# Patient Record
Sex: Male | Born: 1945 | Race: White | Hispanic: No | Marital: Married | Smoking: Never smoker
Health system: Southern US, Community
[De-identification: ages and names within clinical notes are randomized; demographics above are authoritative.]

## PROBLEM LIST (undated history)

## (undated) DIAGNOSIS — E039 Hypothyroidism, unspecified: Secondary | ICD-10-CM

## (undated) DIAGNOSIS — M1990 Unspecified osteoarthritis, unspecified site: Secondary | ICD-10-CM

## (undated) DIAGNOSIS — I10 Essential (primary) hypertension: Secondary | ICD-10-CM

## (undated) HISTORY — PX: OTHER SURGICAL HISTORY: SHX169

## (undated) HISTORY — PX: SHOULDER SURGERY: SHX246

## (undated) HISTORY — PX: KNEE CARTILAGE SURGERY: SHX688

---

## 2019-09-12 ENCOUNTER — Ambulatory Visit: Payer: Medicare Other | Attending: Internal Medicine

## 2019-09-12 DIAGNOSIS — Z23 Encounter for immunization: Secondary | ICD-10-CM | POA: Insufficient documentation

## 2019-09-12 NOTE — Progress Notes (Signed)
   Covid-19 Vaccination Clinic  Name:  [name]    MRN: [id number] DOB: 06-Jan-1946  09/12/2019  [name] was observed post Covid-19 immunization for 15 minutes without incidence. He was provided with Vaccine Information Sheet and instruction to access the V-Safe system.   [name] was instructed to call 911 with any severe reactions post vaccine: Marland Kitchen Difficulty breathing  . Swelling of your face and throat  . A fast heartbeat  . A bad rash all over your body  . Dizziness and weakness    Immunizations Administered    Name Date Dose VIS Date Route   Pfizer COVID-19 Vaccine 09/12/2019  3:24 PM 0.3 mL 07/02/2019 Intramuscular   Manufacturer: [employer], [employer]   Lot: [id number]   NDC: [address]-6

## 2019-10-06 ENCOUNTER — Ambulatory Visit: Payer: Medicare Other | Attending: Internal Medicine

## 2019-10-06 DIAGNOSIS — Z23 Encounter for immunization: Secondary | ICD-10-CM

## 2019-10-06 NOTE — Progress Notes (Signed)
   Covid-19 Vaccination Clinic  Name:  [name]    MRN: [id number] DOB: 25-Jul-1945  10/06/2019  Mr. Balaban was observed post Covid-19 immunization for 15 minutes without incident. He was provided with Vaccine Information Sheet and instruction to access the V-Safe system.   Mr. Manera was instructed to call 911 with any severe reactions post vaccine: Marland Kitchen Difficulty breathing  . Swelling of face and throat  . A fast heartbeat  . A bad rash all over body  . Dizziness and weakness   Immunizations Administered    Name Date Dose VIS Date Route   Pfizer COVID-19 Vaccine 10/06/2019 10:23 AM 0.3 mL 07/02/2019 Intramuscular   Manufacturer: [employer], [employer]   Lot: [id number]   NDC: [address]-5

## 2020-04-06 ENCOUNTER — Other Ambulatory Visit: Payer: Self-pay | Admitting: Internal Medicine

## 2020-04-06 ENCOUNTER — Ambulatory Visit
Admission: RE | Admit: 2020-04-06 | Discharge: 2020-04-06 | Disposition: A | Discharge status: Home / Self Care | Payer: Medicare Other | Source: Ambulatory Visit | Attending: Internal Medicine | Admitting: Internal Medicine

## 2020-04-06 DIAGNOSIS — M25552 Pain in left hip: Secondary | ICD-10-CM

## 2020-04-24 ENCOUNTER — Ambulatory Visit: Payer: Medicare Other | Admitting: Orthopaedic Surgery

## 2020-06-01 ENCOUNTER — Ambulatory Visit
Admission: RE | Admit: 2020-06-01 | Discharge: 2020-06-01 | Disposition: A | Discharge status: Home / Self Care | Payer: Medicare Other | Source: Ambulatory Visit | Attending: Internal Medicine | Admitting: Internal Medicine

## 2020-06-01 ENCOUNTER — Other Ambulatory Visit: Payer: Self-pay | Admitting: Internal Medicine

## 2020-06-01 DIAGNOSIS — R059 Cough, unspecified: Secondary | ICD-10-CM

## 2020-12-26 ENCOUNTER — Ambulatory Visit (INDEPENDENT_AMBULATORY_CARE_PROVIDER_SITE_OTHER): Payer: Medicare Other

## 2020-12-26 ENCOUNTER — Ambulatory Visit (INDEPENDENT_AMBULATORY_CARE_PROVIDER_SITE_OTHER): Payer: Medicare Other | Admitting: Orthopaedic Surgery

## 2020-12-26 VITALS — Ht 71.0 in | Wt 226.0 lb

## 2020-12-26 DIAGNOSIS — M1612 Unilateral primary osteoarthritis, left hip: Secondary | ICD-10-CM

## 2020-12-26 DIAGNOSIS — M25552 Pain in left hip: Secondary | ICD-10-CM

## 2020-12-26 MED ORDER — MELOXICAM 15 MG PO TABS: 15.0000 mg | ORAL_TABLET | Freq: Every day | ORAL | 3 refills | Status: DC | PRN

## 2020-12-26 NOTE — Progress Notes (Signed)
Office Visit Note   Patient: [name]           Date of Birth: 12-18-1945           MRN: [id number] Visit Date: 12/26/2020              Requested by: Georgann Housekeeper, MD [address] [place],  [place] [address] PCP: Georgann Housekeeper, MD   Assessment & Plan: Visit Diagnoses:  1. Pain in left hip   2. Unilateral primary osteoarthritis, left hip     Plan: He has a significant high pain threshold so right now we are only recommending activity modification and oral anti-inflammatories.  I did talk to him about a one-time intra-articular steroid injection done under fluoroscopic or ultrasound guidance.  He said he will call us if he would like to try that type of injection.  I did go over his x-rays with him and showed him a hip replacement model and gave him a handout about hip replacement surgery.  I will start meloxicam as an anti-inflammatory.  All questions and concerns were answered and addressed.  Follow-up is otherwise as needed.  Follow-Up Instructions: Return if symptoms worsen or fail to improve.   Orders:  Orders Placed This Encounter  Procedures  . XR Pelvis 1-2 Views   Meds ordered this encounter  Medications  . meloxicam (MOBIC) 15 MG tablet    Sig: Take 1 tablet (15 mg total) by mouth daily as needed for pain.    Dispense:  30 tablet    Refill:  3      Procedures: No procedures performed   Clinical Data: No additional findings.   Subjective: Chief Complaint  Patient presents with  . Left Hip - Pain  The patient is a very active and pleasant 75 year old gentleman who comes in with a 6-month history of left hip pain which is around his hip and groin area.  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  He has a remote history of right shoulder surgery.  He is a retired IT sales professional.  He says the shoulder is  now calming down but the hip still hurts with weightbearing activities.  He has been sitting for a while and gets up he has stiffness in the left hip and in the groin on the left side.  He denies any injury or surgery otherwise to the left hip.  He denies any sciatica or weakness.  He is very active 76 year old gentleman.  He takes an occasional over-the-counter anti-inflammatory.  He is not diabetic and not a smoker.  HPI  Review of Systems He currently denies any headache, chest pain, shortness of breath, fever, chills, nausea, vomiting  Objective: Vital Signs: Ht 5\' 11"  (1.803 m)   Wt 226 lb (102.5 kg)   BMI 31.52 kg/m   Physical Exam He is alert and orient x3 in no acute distress Ortho Exam Examination of his right hip shows a move smoothly and fluidly.  Examination of his left hip shows that it does hurt quite significantly with internal and external rotation and there is slight stiffness with rotation.  There is no pain over the trochanteric area or the IT band on the left side to palpation. Specialty Comments:  No specialty comments available.  Imaging: XR Pelvis 1-2 Views  Result Date: 12/26/2020  An AP pelvis shows severe osteoarthritis of the left hip.  There is superior lateral joint space narrowing which is significant as well as some slight flattening of the femoral head in this area and para-articular osteophytes.  There is also sclerotic changes.    PMFS History: Patient Active Problem List   Diagnosis Date Noted  . Unilateral primary osteoarthritis, left hip 12/26/2020   No past medical history on file.  No family history on file.   Social History   Occupational History  . Not on file  Tobacco Use  . Smoking status: Not on file  . Smokeless tobacco: Not on file  Substance and Sexual Activity  . Alcohol use: Not on file  . Drug use: Not on file  . Sexual activity: Not on file

## 2021-01-15 ENCOUNTER — Ambulatory Visit: Payer: Self-pay

## 2021-01-15 ENCOUNTER — Encounter: Payer: Self-pay | Admitting: Family Medicine

## 2021-01-15 ENCOUNTER — Ambulatory Visit (INDEPENDENT_AMBULATORY_CARE_PROVIDER_SITE_OTHER): Payer: Medicare Other | Admitting: Family Medicine

## 2021-01-15 DIAGNOSIS — M25552 Pain in left hip: Secondary | ICD-10-CM

## 2021-01-15 NOTE — Progress Notes (Signed)
Subjective: Patient is here for ultrasound-guided intra-articular left hip injection.   Pain is 8/10.  Objective:  Pain with IR.  Procedure: Ultrasound guided injection is preferred based studies that show increased duration, increased effect, greater accuracy, decreased procedural pain, increased response rate, and decreased cost with ultrasound guided versus blind injection.   Verbal informed consent obtained.  Time-out conducted.  Noted no overlying erythema, induration, or other signs of local infection. Ultrasound-guided left hip injection: After sterile prep with Betadine, injected 4 cc 0.25% bupivacaine without epinephrine and 6 mg betamethasone using a 22-gauge spinal needle, passing the needle through the iliofemoral ligament into the femoral head/neck junction.  Injectate seen filling joint capsule.  Good immediate relief.  Moderate effusion noted, clear yellow fluid.

## 2021-02-14 ENCOUNTER — Ambulatory Visit (INDEPENDENT_AMBULATORY_CARE_PROVIDER_SITE_OTHER): Payer: Medicare Other | Admitting: Orthopaedic Surgery

## 2021-02-14 ENCOUNTER — Other Ambulatory Visit: Payer: Self-pay

## 2021-02-14 VITALS — Ht 71.0 in | Wt 226.0 lb

## 2021-02-14 DIAGNOSIS — M25552 Pain in left hip: Secondary | ICD-10-CM

## 2021-02-14 DIAGNOSIS — M1612 Unilateral primary osteoarthritis, left hip: Secondary | ICD-10-CM | POA: Diagnosis not present

## 2021-02-14 NOTE — Progress Notes (Signed)
Office Visit Note   Patient: [name]           Date of Birth: [birth year]           MRN: [id number] Visit Date: 02/14/2021              Requested by: Georgann Housekeeper, MD [address] [place],  [place] [address] PCP: Georgann Housekeeper, MD   Assessment & Plan: Visit Diagnoses:  1. Unilateral primary osteoarthritis, left hip   2. Pain in left hip     Plan: We spoke in length in detail about hip replacement surgery.  I agree with him proceeding with this given the failed conservative treatment combined with his signs and symptoms with clinical exam findings and x-ray findings.  We had a long and thorough discussion about the surgery.  We talked about the risks and benefits of surgery.  I described the expected operative and postoperative course.  I showed him a hip replacement model and went over his x-rays.  He has a handout about hip replacement surgery as well.  He does wish to have this scheduled.  We will work on getting this surgery scheduled for a left total hip arthroplasty.  Follow-Up Instructions: Return for 2 weeks post-op.   Orders:  No orders of the defined types were placed in this encounter.  No orders of the defined types were placed in this encounter.     Procedures: No procedures performed   Clinical Data: No additional findings.   Subjective: Chief Complaint  Patient presents with   Left Hip - Pain  The patient is being seen in follow-up.  He has known severe end-stage arthritis of his left hip.  He has tried conservative treatment for well over a year now.  His hip pain is become daily on the left side.  It is definitely affecting his mobility, his quality of life, and his actives daily living.  He has tried intra-articular steroid injections that did not really help much at all.  His wife is with him today as well.  He has had no other acute change in medical status.  He is not obese.  He is not a diabetic.  He is interested in this point  with a left total hip arthroplasty and I agree with this considering the severity of both arthritis on x-ray and his clinical exam findings as well as signs and symptoms.  HPI  Review of Systems He currently denies any headache, chest pain, shortness of breath, fever, chills, nausea, vomiting  Objective: Vital Signs: Ht 5\' 11"  (1.803 m)   Wt 226 lb (102.5 kg)   BMI 31.52 kg/m   Physical Exam He is alert and orient x3 and in no acute distress Ortho Exam Examination of his right hip is normal.  Examination of his left hip shows significant stiffness with internal and external rotation as well as severe pain with rotation of the left hip. Specialty Comments:  No specialty comments available.  Imaging: No results found. An AP pelvis and lateral of his left hip from his last visit shows severe end-stage arthritis of the left hip.  There is complete loss of superior lateral joint space.  There are para-articular osteophytes and sclerotic changes in the femoral head and acetabulum.  PMFS History: Patient Active Problem List   Diagnosis Date Noted   Unilateral primary osteoarthritis, left hip 12/26/2020   No past medical history on file.  No family history on file.  No  past surgical history on file. Social History   Occupational History   Not on file  Tobacco Use   Smoking status: Not on file   Smokeless tobacco: Not on file  Substance and Sexual Activity   Alcohol use: Not on file   Drug use: Not on file   Sexual activity: Not on file

## 2021-03-16 ENCOUNTER — Other Ambulatory Visit: Payer: Self-pay | Admitting: Physician Assistant

## 2021-03-19 NOTE — Progress Notes (Signed)
DUE TO COVID-19 ONLY ONE VISITOR IS ALLOWED TO COME WITH YOU AND STAY IN THE WAITING ROOM ONLY DURING PRE OP AND PROCEDURE DAY OF SURGERY.  2 VISITOR  MAY VISIT WITH YOU AFTER SURGERY IN YOUR PRIVATE ROOM DURING VISITING HOURS ONLY!  YOU NEED TO HAVE A COVID 19 TEST ON__9/01/2021 ____@_  @_from  8am-3pm _____ , THIS TEST MUST BE DONE BEFORE SURGERY,  Covid test is done at [address] [place], [place] [address].  This is a drive thru.  No appt required. Please see map.                 Your procedure is scheduled on:  03/30/2021   Report to [facility] Main  Entrance   Report to admitting at     [phone]     Call this number if you have problems the morning of surgery [phone]    REMEMBER: NO  SOLID FOOD CANDY OR GUM AFTER MIDNIGHT. CLEAR LIQUIDS UNTIL   0530am         . NOTHING BY MOUTH EXCEPT CLEAR LIQUIDS UNTIL    0530am   . PLEASE FINISH ENSURE DRINK PER SURGEON ORDER  WHICH NEEDS TO BE COMPLETED AT      .  0530am     CLEAR LIQUID DIET   Foods Allowed                                                                    Coffee and tea, regular and decaf                            Fruit ices (not with fruit pulp)                                      Iced Popsicles                                    Carbonated beverages, regular and diet                                    Cranberry, grape and apple juices Sports drinks like Gatorade Lightly seasoned clear broth or consume(fat free) Sugar, honey syrup ___________________________________________________________________      BRUSH YOUR TEETH MORNING OF SURGERY AND RINSE YOUR MOUTH OUT, NO CHEWING GUM CANDY OR MINTS.     Take these medicines the morning of surgery with A SIP OF WATER:   amlodipine, synthroid   DO NOT TAKE ANY DIABETIC MEDICATIONS DAY OF YOUR SURGERY                               You may not have any metal on your body including hair pins and              piercings  Do not wear jewelry, make-up,  lotions, powders or perfumes, deodorant  Do not wear nail polish on your fingernails.  Do not shave  48 hours prior to surgery.              Men may shave face and neck.   Do not bring valuables to the hospital. [place].  Contacts, dentures or bridgework may not be worn into surgery.  Leave suitcase in the car. After surgery it may be brought to your room.     Patients discharged the day of surgery will not be allowed to drive home. IF YOU ARE HAVING SURGERY AND GOING HOME THE SAME DAY, YOU MUST HAVE AN ADULT TO DRIVE YOU HOME AND BE WITH YOU FOR 24 HOURS. YOU MAY GO HOME BY TAXI OR UBER OR ORTHERWISE, BUT AN ADULT MUST ACCOMPANY YOU HOME AND STAY WITH YOU FOR 24 HOURS.  Name and phone number of your driver:  Special Instructions: N/A              Please read over the following fact sheets you were given: _____________________________________________________________________  [facility] - Preparing for Surgery Before surgery, you can play an important role.  Because skin is not sterile, your skin needs to be as free of germs as possible.  You can reduce the number of germs on your skin by washing with CHG (chlorahexidine gluconate) soap before surgery.  CHG is an antiseptic cleaner which kills germs and bonds with the skin to continue killing germs even after washing. Please DO NOT use if you have an allergy to CHG or antibacterial soaps.  If your skin becomes reddened/irritated stop using the CHG and inform your nurse when you arrive at Short Stay. Do not shave (including legs and underarms) for at least 48 hours prior to the first CHG shower.  You may shave your face/neck. Please follow these instructions carefully:  1.  Shower with CHG Soap the night before surgery and the  morning of Surgery.  2.  If you choose to wash your hair, wash your hair first as usual with your  normal  shampoo.  3.  After you shampoo, rinse your hair  and body thoroughly to remove the  shampoo.                           4.  Use CHG as you would any other liquid soap.  You can apply chg directly  to the skin and wash                       Gently with a scrungie or clean washcloth.  5.  Apply the CHG Soap to your body ONLY FROM THE NECK DOWN.   Do not use on face/ open                           Wound or open sores. Avoid contact with eyes, ears mouth and genitals (private parts).                       Wash face,  Genitals (private parts) with your normal soap.             6.  Wash thoroughly, paying special attention to the area where your surgery  will be performed.  7.  Thoroughly rinse your body with  warm water from the neck down.  8.  DO NOT shower/wash with your normal soap after using and rinsing off  the CHG Soap.                9.  Pat yourself dry with a clean towel.            10.  Wear clean pajamas.            11.  Place clean sheets on your bed the night of your first shower and do not  sleep with pets. Day of Surgery : Do not apply any lotions/deodorants the morning of surgery.  Please wear clean clothes to the hospital/surgery center.  FAILURE TO FOLLOW THESE INSTRUCTIONS MAY RESULT IN THE CANCELLATION OF YOUR SURGERY PATIENT SIGNATURE_________________________________  NURSE SIGNATURE__________________________________  ________________________________________________________________________

## 2021-03-21 ENCOUNTER — Other Ambulatory Visit: Payer: Self-pay

## 2021-03-21 ENCOUNTER — Encounter (HOSPITAL_COMMUNITY): Payer: Self-pay

## 2021-03-21 ENCOUNTER — Telehealth: Payer: Self-pay

## 2021-03-21 ENCOUNTER — Encounter (HOSPITAL_COMMUNITY)
Admission: RE | Admit: 2021-03-21 | Discharge: 2021-03-21 | Disposition: A | Discharge status: Home / Self Care | Payer: Medicare Other | Source: Ambulatory Visit | Attending: Orthopaedic Surgery | Admitting: Orthopaedic Surgery

## 2021-03-21 DIAGNOSIS — Z01818 Encounter for other preprocedural examination: Secondary | ICD-10-CM | POA: Insufficient documentation

## 2021-03-21 HISTORY — DX: Essential (primary) hypertension: I10

## 2021-03-21 HISTORY — DX: Hypothyroidism, unspecified: E03.9

## 2021-03-21 HISTORY — DX: Unspecified osteoarthritis, unspecified site: M19.90

## 2021-03-21 LAB — BASIC METABOLIC PANEL
Anion gap: 8 (ref 5–15)
BUN: 23 mg/dL (ref 8–23)
CO2: 27 mmol/L (ref 22–32)
Calcium: 9.3 mg/dL (ref 8.9–10.3)
Chloride: 108 mmol/L (ref 98–111)
Creatinine, Ser: 0.99 mg/dL (ref 0.61–1.24)
GFR, Estimated: >60 mL/min (ref >60)
Glucose, Bld: 116 mg/dL — ABNORMAL HIGH (ref 70–99)
Potassium: 4 mmol/L (ref 3.5–5.1)
Sodium: 143 mmol/L (ref 135–145)

## 2021-03-21 LAB — CBC
HCT: 47.9 % (ref 39.0–52.0)
Hemoglobin: 15.6 g/dL (ref 13.0–17.0)
MCH: 27.7 pg (ref 26.0–34.0)
MCHC: 32.6 g/dL (ref 30.0–36.0)
MCV: 84.9 fL (ref 80.0–100.0)
Platelets: 253 10*3/uL (ref 150–400)
RBC: 5.64 MIL/uL (ref 4.22–5.81)
RDW: 13.2 % (ref 11.5–15.5)
WBC: 7.3 10*3/uL (ref 4.0–10.5)
nRBC: 0 % (ref 0.0–0.2)

## 2021-03-21 LAB — SURGICAL PCR SCREEN
MRSA, PCR: NEGATIVE
Staphylococcus aureus: POSITIVE — AB

## 2021-03-21 NOTE — Progress Notes (Addendum)
Anesthesia Review:  PCP: DR Georgann Housekeeper  Called and requested most recent ov note.[facility] They are to fax. Cardiologist : none  Chest x-ray : EKG :03/21/21  Echo : Stress test: Cardiac Cath :  Activity level: can do  a flgiht of stairs without diffficulty  Sleep Study/ CPAP : none  Fasting Blood Sugar :      / Checks Blood Sugar -- times a day:   Blood Thinner/ Instructions /Last Dose: ASA / Instructions/ Last Dose :   Covid test on 03/28/21 unless changes surgery to ambulatory .  PT to talk to surgeon office on 03/21/21 in regards to this.

## 2021-03-21 NOTE — Telephone Encounter (Signed)
Scheduled for THA on 03/30/21 8:30 a.m. at [facility].  Patient wants same day discharge.  Please advise.

## 2021-03-27 ENCOUNTER — Telehealth: Payer: Self-pay | Admitting: *Deleted

## 2021-03-27 NOTE — Telephone Encounter (Signed)
Ortho bundle pre-op call completed. 

## 2021-03-27 NOTE — Care Plan (Signed)
RNCM call to patient to review his upcoming Left total hip arthroplasty with Dr. Magnus Ivan at [facility] on 03/30/21. Patient is an Ortho bundle through [facility]. He is agreeable to case management and also would like to go home same day discharge after his surgery. Reviewed information related to this as well as post-op care instructions. He has a wife that is a retired Engineer, civil (consulting) that will be assisting him. He has no DME, but CM will order a FWW to be given to him by staff prior to discharge. Patient really didn't want this, but is agreeable that if he needs it, he will take it. Choice provided and referral made to [facility]. Will continue to follow for needs.

## 2021-03-29 ENCOUNTER — Other Ambulatory Visit: Payer: Self-pay

## 2021-03-29 NOTE — H&P (Signed)
TOTAL HIP ADMISSION H&P  Patient is admitted for left total hip arthroplasty.  Subjective:  Chief Complaint: left hip pain  HPI: [name], 75 y.o. male, has a history of pain and functional disability in the left hip(s) due to arthritis and patient has failed non-surgical conservative treatments for greater than 12 weeks to include NSAID's and/or analgesics, corticosteriod injections, flexibility and strengthening excercises, and activity modification.  Onset of symptoms was abrupt starting 1 years ago with gradually worsening course since that time.The patient noted no past surgery on the left hip(s).  Patient currently rates pain in the left hip at 10 out of 10 with activity. Patient has night pain, worsening of pain with activity and weight bearing, pain that interfers with activities of daily living, and pain with passive range of motion. Patient has evidence of subchondral sclerosis, periarticular osteophytes, and joint space narrowing by imaging studies. This condition presents safety issues increasing the risk of falls.  There is no current active infection.  Patient Active Problem List   Diagnosis Date Noted   Unilateral primary osteoarthritis, left hip 12/26/2020   Past Medical History:  Diagnosis Date   Arthritis    Hypertension    Hypothyroidism     Past Surgical History:  Procedure Laterality Date   KNEE CARTILAGE SURGERY     LEFT LEG SURGERY      GROWTH ON BONE   SHOULDER SURGERY     X 2    No current facility-administered medications for this encounter.   Current Outpatient Medications  Medication Sig Dispense Refill Last Dose   amLODipine (NORVASC) 5 MG tablet Take 5 mg by mouth daily. PT TAKES IN THE PM      Cholecalciferol (VITAMIN D3 PO) Take 1 tablet by mouth daily.      levothyroxine (SYNTHROID) 75 MCG tablet Take 75 mcg by mouth daily before breakfast. PT TAKES IN THE PM      Multiple Vitamin (MULTIVITAMIN WITH MINERALS) TABS tablet Take 1 tablet by  mouth daily.      multivitamin-lutein (OCUVITE-LUTEIN) CAPS capsule Take 1 capsule by mouth daily.      Omega-3 Fatty Acids (FISH OIL TRIPLE STRENGTH) 1400 MG CAPS Take 1,400 mg by mouth daily.      Pitavastatin Calcium (LIVALO) 2 MG TABS Take 2 mg by mouth daily. PT TAKES IN THE PM      meloxicam (MOBIC) 15 MG tablet Take 1 tablet (15 mg total) by mouth daily as needed for pain. 30 tablet 3    No Known Allergies  Social History   Tobacco Use   Smoking status: Never   Smokeless tobacco: Never  Substance Use Topics   Alcohol use: Yes    Comment: OCCAS    No family history on file.   Review of Systems  All other systems reviewed and are negative.  Objective:  Physical Exam Vitals reviewed.  Constitutional:      Appearance: Normal appearance.  HENT:     Head: Normocephalic and atraumatic.  Eyes:     Extraocular Movements: Extraocular movements intact.     Pupils: Pupils are equal, round, and reactive to light.  Cardiovascular:     Rate and Rhythm: Normal rate.  Pulmonary:     Effort: Pulmonary effort is normal.     Breath sounds: Normal breath sounds.  Abdominal:     Palpations: Abdomen is soft.  Musculoskeletal:     Cervical back: Normal range of motion and neck supple.     Left  hip: Tenderness and bony tenderness present. Decreased range of motion. Decreased strength.  Neurological:     Mental Status: He is alert and oriented to person, place, and time.  Psychiatric:        Behavior: Behavior normal.    Vital signs in last 24 hours:    Labs:   Estimated body mass index is 26.5 kg/m as calculated from the following:   Height as of 03/21/21: 5\' 11"  (1.803 m).   Weight as of 03/21/21: 86.2 kg.   Imaging Review Plain radiographs demonstrate severe degenerative joint disease of the left hip(s). The bone quality appears to be good for age and reported activity level.      Assessment/Plan:  End stage arthritis, left hip(s)  The patient history, physical  examination, clinical judgement of the provider and imaging studies are consistent with end stage degenerative joint disease of the left hip(s) and total hip arthroplasty is deemed medically necessary. The treatment options including medical management, injection therapy, arthroscopy and arthroplasty were discussed at length. The risks and benefits of total hip arthroplasty were presented and reviewed. The risks due to aseptic loosening, infection, stiffness, dislocation/subluxation,  thromboembolic complications and other imponderables were discussed.  The patient acknowledged the explanation, agreed to proceed with the plan and consent was signed. Patient is being admitted for inpatient treatment for surgery, pain control, PT, OT, prophylactic antibiotics, VTE prophylaxis, progressive ambulation and ADL's and discharge planning.The patient is planning to be discharged home with home health services

## 2021-03-30 ENCOUNTER — Ambulatory Visit (HOSPITAL_COMMUNITY): Payer: Medicare Other

## 2021-03-30 ENCOUNTER — Encounter (HOSPITAL_COMMUNITY)
Admission: RE | Disposition: A | Discharge status: Home / Self Care | Payer: Self-pay | Source: Ambulatory Visit | Attending: Orthopaedic Surgery

## 2021-03-30 ENCOUNTER — Ambulatory Visit (HOSPITAL_COMMUNITY): Payer: Medicare Other | Admitting: Physician Assistant

## 2021-03-30 ENCOUNTER — Encounter (HOSPITAL_COMMUNITY): Payer: Self-pay | Admitting: Orthopaedic Surgery

## 2021-03-30 ENCOUNTER — Ambulatory Visit (HOSPITAL_COMMUNITY): Payer: Medicare Other | Admitting: Certified Registered Nurse Anesthetist

## 2021-03-30 ENCOUNTER — Telehealth: Payer: Self-pay | Admitting: *Deleted

## 2021-03-30 ENCOUNTER — Ambulatory Visit (HOSPITAL_COMMUNITY)
Admission: RE | Admit: 2021-03-30 | Discharge: 2021-03-30 | Disposition: A | Discharge status: Home / Self Care | Payer: Medicare Other | Source: Ambulatory Visit | Attending: Orthopaedic Surgery | Admitting: Orthopaedic Surgery

## 2021-03-30 DIAGNOSIS — M1612 Unilateral primary osteoarthritis, left hip: Secondary | ICD-10-CM | POA: Diagnosis present

## 2021-03-30 DIAGNOSIS — Z96642 Presence of left artificial hip joint: Secondary | ICD-10-CM

## 2021-03-30 DIAGNOSIS — M25752 Osteophyte, left hip: Secondary | ICD-10-CM | POA: Diagnosis not present

## 2021-03-30 DIAGNOSIS — Z79899 Other long term (current) drug therapy: Secondary | ICD-10-CM | POA: Diagnosis not present

## 2021-03-30 DIAGNOSIS — Z79890 Hormone replacement therapy (postmenopausal): Secondary | ICD-10-CM | POA: Insufficient documentation

## 2021-03-30 DIAGNOSIS — Z419 Encounter for procedure for purposes other than remedying health state, unspecified: Secondary | ICD-10-CM

## 2021-03-30 HISTORY — PX: TOTAL HIP ARTHROPLASTY: SHX124

## 2021-03-30 LAB — TYPE AND SCREEN
ABO/RH(D): A POS
Antibody Screen: NEGATIVE

## 2021-03-30 LAB — ABO/RH: ABO/RH(D): A POS

## 2021-03-30 SURGERY — ARTHROPLASTY, HIP, TOTAL, ANTERIOR APPROACH
Anesthesia: Spinal | Site: Hip | Laterality: Left

## 2021-03-30 MED ORDER — METHOCARBAMOL 500 MG PO TABS: 500.0000 mg | ORAL_TABLET | Freq: Four times a day (QID) | ORAL | 1 refills | Status: DC | PRN

## 2021-03-30 MED ORDER — PROPOFOL 1000 MG/100ML IV EMUL
INTRAVENOUS | Status: AC
  Filled 2021-03-30: qty 100

## 2021-03-30 MED ORDER — LACTATED RINGERS IV SOLN: INTRAVENOUS | Status: DC

## 2021-03-30 MED ORDER — CEFAZOLIN SODIUM-DEXTROSE 1-4 GM/50ML-% IV SOLN: 1.0000 g | Freq: Four times a day (QID) | INTRAVENOUS | Status: DC

## 2021-03-30 MED ORDER — PROPOFOL 500 MG/50ML IV EMUL
INTRAVENOUS | Status: DC | PRN
  Administered 2021-03-30: 75 ug/kg/min via INTRAVENOUS

## 2021-03-30 MED ORDER — OXYCODONE HCL 5 MG PO TABS: 10.0000 mg | ORAL_TABLET | ORAL | Status: DC | PRN

## 2021-03-30 MED ORDER — ONDANSETRON HCL 4 MG/2ML IJ SOLN
INTRAMUSCULAR | Status: AC
  Filled 2021-03-30: qty 2

## 2021-03-30 MED ORDER — OXYCODONE HCL 5 MG/5ML PO SOLN: 5.0000 mg | Freq: Once | ORAL | Status: AC | PRN

## 2021-03-30 MED ORDER — OXYCODONE HCL 5 MG PO TABS: 5.0000 mg | ORAL_TABLET | Freq: Four times a day (QID) | ORAL | 0 refills | Status: DC | PRN

## 2021-03-30 MED ORDER — DEXAMETHASONE SODIUM PHOSPHATE 10 MG/ML IJ SOLN
INTRAMUSCULAR | Status: DC | PRN
  Administered 2021-03-30: 8 mg via INTRAVENOUS

## 2021-03-30 MED ORDER — POVIDONE-IODINE 10 % EX SWAB
2.0000 "application " | Freq: Once | CUTANEOUS | Status: AC
  Administered 2021-03-30: 2 via TOPICAL

## 2021-03-30 MED ORDER — METHOCARBAMOL 500 MG IVPB - SIMPLE MED: 500.0000 mg | Freq: Four times a day (QID) | INTRAVENOUS | Status: DC | PRN

## 2021-03-30 MED ORDER — FENTANYL CITRATE (PF) 100 MCG/2ML IJ SOLN
INTRAMUSCULAR | Status: DC | PRN
  Administered 2021-03-30 (×2): 50 ug via INTRAVENOUS

## 2021-03-30 MED ORDER — DEXAMETHASONE SODIUM PHOSPHATE 10 MG/ML IJ SOLN
INTRAMUSCULAR | Status: AC
  Filled 2021-03-30: qty 1

## 2021-03-30 MED ORDER — BUPIVACAINE IN DEXTROSE 0.75-8.25 % IT SOLN
INTRATHECAL | Status: DC | PRN
  Administered 2021-03-30: 1.8 mL via INTRATHECAL

## 2021-03-30 MED ORDER — FENTANYL CITRATE (PF) 100 MCG/2ML IJ SOLN
INTRAMUSCULAR | Status: AC
  Filled 2021-03-30: qty 2

## 2021-03-30 MED ORDER — OXYCODONE HCL 5 MG PO TABS
5.0000 mg | ORAL_TABLET | Freq: Once | ORAL | Status: AC | PRN
Start: 2021-03-30 — End: 2021-03-30
  Administered 2021-03-30: 5 mg via ORAL

## 2021-03-30 MED ORDER — PHENYLEPHRINE HCL-NACL 20-0.9 MG/250ML-% IV SOLN
INTRAVENOUS | Status: DC | PRN
  Administered 2021-03-30: 25 ug/min via INTRAVENOUS

## 2021-03-30 MED ORDER — DEXMEDETOMIDINE (PRECEDEX) IN NS 20 MCG/5ML (4 MCG/ML) IV SYRINGE
PREFILLED_SYRINGE | INTRAVENOUS | Status: DC | PRN
  Administered 2021-03-30: 4 ug via INTRAVENOUS

## 2021-03-30 MED ORDER — EPHEDRINE SULFATE-NACL 50-0.9 MG/10ML-% IV SOSY
PREFILLED_SYRINGE | INTRAVENOUS | Status: DC | PRN
  Administered 2021-03-30 (×2): 10 mg via INTRAVENOUS
  Administered 2021-03-30: 5 mg via INTRAVENOUS

## 2021-03-30 MED ORDER — SODIUM CHLORIDE 0.9 % IR SOLN
Status: DC | PRN
  Administered 2021-03-30: 1000 mL

## 2021-03-30 MED ORDER — PHENYLEPHRINE HCL-NACL 20-0.9 MG/250ML-% IV SOLN
INTRAVENOUS | Status: AC
  Filled 2021-03-30: qty 250

## 2021-03-30 MED ORDER — ORAL CARE MOUTH RINSE: 15.0000 mL | Freq: Once | OROMUCOSAL | Status: AC

## 2021-03-30 MED ORDER — ONDANSETRON HCL 4 MG/2ML IJ SOLN
4.0000 mg | Freq: Once | INTRAMUSCULAR | Status: DC | PRN
Start: 2021-03-30 — End: 2021-03-30

## 2021-03-30 MED ORDER — CHLORHEXIDINE GLUCONATE 0.12 % MT SOLN
15.0000 mL | Freq: Once | OROMUCOSAL | Status: AC
  Administered 2021-03-30: 15 mL via OROMUCOSAL

## 2021-03-30 MED ORDER — LACTATED RINGERS IV BOLUS
500.0000 mL | Freq: Once | INTRAVENOUS | Status: AC
  Administered 2021-03-30: 500 mL via INTRAVENOUS

## 2021-03-30 MED ORDER — METHOCARBAMOL 500 MG PO TABS: 500.0000 mg | ORAL_TABLET | Freq: Four times a day (QID) | ORAL | Status: DC | PRN

## 2021-03-30 MED ORDER — 0.9 % SODIUM CHLORIDE (POUR BTL) OPTIME
TOPICAL | Status: DC | PRN
  Administered 2021-03-30: 1000 mL

## 2021-03-30 MED ORDER — HYDROMORPHONE HCL 1 MG/ML IJ SOLN: 0.2500 mg | INTRAMUSCULAR | Status: DC | PRN

## 2021-03-30 MED ORDER — EPHEDRINE 5 MG/ML INJ
INTRAVENOUS | Status: AC
  Filled 2021-03-30: qty 5

## 2021-03-30 MED ORDER — PHENYLEPHRINE 40 MCG/ML (10ML) SYRINGE FOR IV PUSH (FOR BLOOD PRESSURE SUPPORT)
PREFILLED_SYRINGE | INTRAVENOUS | Status: DC | PRN
  Administered 2021-03-30 (×2): 40 ug via INTRAVENOUS

## 2021-03-30 MED ORDER — ONDANSETRON HCL 4 MG/2ML IJ SOLN
INTRAMUSCULAR | Status: DC | PRN
  Administered 2021-03-30: 4 mg via INTRAVENOUS

## 2021-03-30 MED ORDER — TRANEXAMIC ACID-NACL 1000-0.7 MG/100ML-% IV SOLN
1000.0000 mg | INTRAVENOUS | Status: AC
  Administered 2021-03-30: 1000 mg via INTRAVENOUS
  Filled 2021-03-30: qty 100

## 2021-03-30 MED ORDER — LACTATED RINGERS IV BOLUS
250.0000 mL | Freq: Once | INTRAVENOUS | Status: AC
  Administered 2021-03-30: 250 mL via INTRAVENOUS

## 2021-03-30 MED ORDER — CEFAZOLIN SODIUM-DEXTROSE 2-4 GM/100ML-% IV SOLN
2.0000 g | INTRAVENOUS | Status: AC
  Administered 2021-03-30: 2 g via INTRAVENOUS
  Filled 2021-03-30: qty 100

## 2021-03-30 MED ORDER — PROPOFOL 10 MG/ML IV BOLUS
INTRAVENOUS | Status: DC | PRN
  Administered 2021-03-30: 20 mg via INTRAVENOUS

## 2021-03-30 MED ORDER — STERILE WATER FOR IRRIGATION IR SOLN
Status: DC | PRN
  Administered 2021-03-30: 2000 mL

## 2021-03-30 MED ORDER — PHENYLEPHRINE 40 MCG/ML (10ML) SYRINGE FOR IV PUSH (FOR BLOOD PRESSURE SUPPORT)
PREFILLED_SYRINGE | INTRAVENOUS | Status: AC
  Filled 2021-03-30: qty 10

## 2021-03-30 MED ORDER — OXYCODONE HCL 5 MG PO TABS: 5.0000 mg | ORAL_TABLET | ORAL | Status: DC | PRN

## 2021-03-30 MED ORDER — OXYCODONE HCL 5 MG PO TABS
ORAL_TABLET | ORAL | Status: AC
  Filled 2021-03-30: qty 1

## 2021-03-30 SURGICAL SUPPLY — 41 items
ARTICULEZE HEAD (Hips) ×2 IMPLANT
BAG COUNTER SPONGE SURGICOUNT (BAG) ×2 IMPLANT
BAG ZIPLOCK 12X15 (MISCELLANEOUS) IMPLANT
BENZOIN TINCTURE PRP APPL 2/3 (GAUZE/BANDAGES/DRESSINGS) IMPLANT
BLADE SAW SGTL 18X1.27X75 (BLADE) ×2 IMPLANT
COVER PERINEAL POST (MISCELLANEOUS) ×2 IMPLANT
COVER SURGICAL LIGHT HANDLE (MISCELLANEOUS) ×2 IMPLANT
CUP ACET PNNCL SECTR W/GRIP 56 (Hips) ×1 IMPLANT
DRAPE FOOT SWITCH (DRAPES) ×2 IMPLANT
DRAPE STERI IOBAN 125X83 (DRAPES) ×2 IMPLANT
DRAPE U-SHAPE 47X51 STRL (DRAPES) ×4 IMPLANT
DRSG AQUACEL AG ADV 3.5X10 (GAUZE/BANDAGES/DRESSINGS) ×2 IMPLANT
DURAPREP 26ML APPLICATOR (WOUND CARE) ×2 IMPLANT
ELECT REM PT RETURN 15FT ADLT (MISCELLANEOUS) ×2 IMPLANT
ELIMINATOR HOLE APEX DEPUY (Hips) ×2 IMPLANT
GAUZE XEROFORM 1X8 LF (GAUZE/BANDAGES/DRESSINGS) ×2 IMPLANT
GLOVE SRG 8 PF TXTR STRL LF DI (GLOVE) ×2 IMPLANT
GLOVE SURG ENC MOIS LTX SZ7.5 (GLOVE) ×2 IMPLANT
GLOVE SURG NEOPR MICRO LF SZ8 (GLOVE) ×2 IMPLANT
GLOVE SURG UNDER POLY LF SZ8 (GLOVE) ×2
GOWN STRL REUS W/TWL XL LVL3 (GOWN DISPOSABLE) ×4 IMPLANT
HANDPIECE INTERPULSE COAX TIP (DISPOSABLE) ×1
HEAD ARTICULEZE (Hips) ×1 IMPLANT
HOLDER FOLEY CATH W/STRAP (MISCELLANEOUS) ×2 IMPLANT
KIT TURNOVER KIT A (KITS) ×2 IMPLANT
PACK ANTERIOR HIP CUSTOM (KITS) ×2 IMPLANT
PENCIL SMOKE EVACUATOR (MISCELLANEOUS) IMPLANT
PINN SECTOR W/GRIP ACE CUP 56 (Hips) ×2 IMPLANT
PINNACLE ALTRX PLUS 4 N 36X56 (Hips) ×2 IMPLANT
SET HNDPC FAN SPRY TIP SCT (DISPOSABLE) ×1 IMPLANT
STAPLER VISISTAT 35W (STAPLE) IMPLANT
STEM FEMORAL SZ5 HIGH ACTIS (Stem) ×2 IMPLANT
STRIP CLOSURE SKIN 1/2X4 (GAUZE/BANDAGES/DRESSINGS) IMPLANT
SUT ETHIBOND NAB CT1 #1 30IN (SUTURE) ×2 IMPLANT
SUT ETHILON 2 0 PS N (SUTURE) IMPLANT
SUT MNCRL AB 4-0 PS2 18 (SUTURE) IMPLANT
SUT VIC AB 0 CT1 36 (SUTURE) ×2 IMPLANT
SUT VIC AB 1 CT1 36 (SUTURE) ×2 IMPLANT
SUT VIC AB 2-0 CT1 27 (SUTURE) ×2
SUT VIC AB 2-0 CT1 TAPERPNT 27 (SUTURE) ×2 IMPLANT
TRAY FOLEY MTR SLVR 16FR STAT (SET/KITS/TRAYS/PACK) IMPLANT

## 2021-03-30 NOTE — Brief Op Note (Signed)
03/30/2021  9:48 AM  PATIENT:  [name]  74 y.o. male  PRE-OPERATIVE DIAGNOSIS:  OSTEOARTHRITIS LEFT HIP  POST-OPERATIVE DIAGNOSIS:  OSTEOARTHRITIS LEFT HIP  PROCEDURE:  Procedure(s): LEFT TOTAL HIP ARTHROPLASTY ANTERIOR APPROACH (Left)  SURGEON:  Surgeon(s) and Role:    Kathryne Hitch, MD - Primary  PHYSICIAN ASSISTANT:  Rexene Edison, PA-C  ANESTHESIA:   spinal  EBL:  150 mL   COUNTS:  YES  DICTATION: .Other Dictation: Dictation Number [id number]  PLAN OF CARE: Discharge to home after PACU  PATIENT DISPOSITION:  PACU - hemodynamically stable.   Delay start of Pharmacological VTE agent (>24hrs) due to surgical blood loss or risk of bleeding: no

## 2021-03-30 NOTE — Anesthesia Procedure Notes (Addendum)
Spinal  Patient location during procedure: OR Start time: 03/30/2021 8:33 AM End time: 03/30/2021 8:36 AM Reason for block: surgical anesthesia Staffing Performed: resident/CRNA  Anesthesiologist: Myrtie Soman, MD Resident/CRNA: West Pugh, CRNA Preanesthetic Checklist Completed: patient identified, IV checked, site marked, risks and benefits discussed, surgical consent, monitors and equipment checked, pre-op evaluation and timeout performed Spinal Block Patient position: sitting Prep: DuraPrep and site prepped and draped Patient monitoring: heart rate, continuous pulse ox and blood pressure Approach: midline Location: L3-4 Injection technique: single-shot Needle Needle type: Pencan and Introducer  Needle gauge: 24 G Needle length: 10 cm Assessment Sensory level: T4 Events: CSF return Additional Notes IV functioning, monitors applied to pt. Expiration date of kit checked and confirmed to be in date. Sterile prep and drape, hand hygiene and sterile gloved used. Pt was positioned and spine was prepped in sterile fashion. Skin was anesthetized with lidocaine. Free flow of clear CSF obtained prior to injecting local anesthetic into CSF x 1 attempt. Spinal needle aspirated freely following injection. Needle was carefully withdrawn, and pt tolerated procedure well. Loss of motor and sensory on exam post injection. Dr Kalman Shan present for procedure.

## 2021-03-30 NOTE — Care Plan (Signed)
Ortho Bundle Case Management Note  Patient Details  Name: [name] MRN: [id number] Date of Birth: [birth year]  [facility] call to patient to review his upcoming Left total hip arthroplasty with Dr. Magnus Ivan at [facility] on 03/30/21. Patient is an Ortho bundle through [facility]. He is agreeable to case management and also would like to go home same day discharge after his surgery. Reviewed information related to this as well as post-op care instructions. He has a wife that is a retired Engineer, civil (consulting) that will be assisting him. He has no DME, but CM will order a RW to be given to him by staff prior to discharge. Patient really didn't want this, but is agreeable that if he needs it, he will take it. Choice provided and referral made to [facility]. Will continue to follow for needs.                  DME Arranged:  Dan Humphreys rolling DME Agency:  [employer]  HH Arranged:  PT HH Agency:  [facility]  Additional Comments: Please contact me with any questions of if this plan should need to change.  Ralph Dowdy, RN, BSN, [employer]  [phone] 03/30/2021, 9:45 AM

## 2021-03-30 NOTE — Transfer of Care (Signed)
Immediate Anesthesia Transfer of Care Note  Patient: [name]  Procedure(s) Performed: LEFT TOTAL HIP ARTHROPLASTY ANTERIOR APPROACH (Left: Hip)  Patient Location: PACU  Anesthesia Type:MAC,Spinal  Level of Consciousness: awake, drowsy and patient cooperative  Airway & Oxygen Therapy: Patient Spontanous Breathing and Patient connected to face mask oxygen  Post-op Assessment: Report given to RN and Post -op Vital signs reviewed and stable  Post vital signs: Reviewed and stable  Last Vitals:  Vitals Value Taken Time  BP 95/55 03/30/21 1009  Temp    Pulse 68 03/30/21 1012  Resp 21 03/30/21 1012  SpO2 98 % 03/30/21 1012  Vitals shown include unvalidated device data.  Last Pain:  Vitals:   03/30/21 7121  TempSrc:   PainSc: 0-No pain         Complications: No notable events documented.

## 2021-03-30 NOTE — OR Nursing (Signed)
I&O catheter performed by Baruch Merl, RN per Dr. Magnus Ivan. urine output.

## 2021-03-30 NOTE — Discharge Instructions (Signed)

## 2021-03-30 NOTE — Anesthesia Procedure Notes (Signed)
Procedure Name: MAC Date/Time: 03/30/2021 8:33 AM Performed by: West Pugh, CRNA Pre-anesthesia Checklist: Patient identified, Emergency Drugs available, Suction available, Patient being monitored and Timeout performed Patient Re-evaluated:Patient Re-evaluated prior to induction Oxygen Delivery Method: Simple face mask Preoxygenation: Pre-oxygenation with 100% oxygen Induction Type: IV induction Placement Confirmation: positive ETCO2 Dental Injury: Teeth and Oropharynx as per pre-operative assessment

## 2021-03-30 NOTE — Telephone Encounter (Signed)
Ortho bundle D/C call today.

## 2021-03-30 NOTE — Op Note (Signed)
[name], [name] MEDICAL RECORD NO: [id number] ACCOUNT NO: [id number] DATE OF BIRTH: 02-May-1946 FACILITY: [facility] LOCATION: [facility] PHYSICIAN: Vanita Panda. Magnus Ivan, MD  Operative Report   DATE OF PROCEDURE: 03/30/2021  PREOPERATIVE DIAGNOSES:  Primary osteoarthritis and degenerative joint disease, left hip.  POSTOPERATIVE DIAGNOSES:  Primary osteoarthritis and degenerative joint disease, left hip.  PROCEDURE:  Left total hip arthroplasty through direct anterior approach.  IMPLANTS:  DePuy Sector Gription acetabular component size 56, size 36+4 neutral polyethylene liner, size 5 ACTIS femoral component with high offset, size 36+5 metal hip ball.  SURGEON:  Vanita Panda. Magnus Ivan, MD  ASSISTANT:  Richardean Canal, PA-C  ANESTHESIA:  Spinal.  ANTIBIOTICS:  2 g IV Ancef.  BLOOD LOSS:  150 mL.  COMPLICATIONS:  None.  INDICATIONS:  The patient is a 75 year old active individual well known to me.  He has well documented arthritis involving his left hip, and at this point, his pain is daily and it is detrimentally affecting his mobility, his quality of life and his  activities of daily living to the point he does wish to proceed with a total hip arthroplasty.  We talked in length and detail about the risk of acute blood loss anemia, nerve and vessel injury, fracture, infection, dislocation, DVT, implant failure and  skin and soft tissue issues as well as leg length discrepancies.  We talked about our goals being decrease pain, improve mobility and overall improve quality of life.  DESCRIPTION OF PROCEDURE:  After informed consent was obtained, appropriate left hip was marked. He was brought to the operating room and sat up on the stretcher where spinal anesthesia was obtained. He was laid in supine position on the stretcher.  I  was able to assess his leg length and placed traction boots on both his feet.  He was then placed supine on the Hana fracture table, the perineal post in  place and both legs in line skeletal traction device and no traction applied.  His left operative  hip was prepped and draped with DuraPrep and sterile drapes.  A timeout was called and he was identified correct patient, correct left hip.  We then made an incision just inferior and posterior to the anterior superior iliac spine and carried this  obliquely down the leg.  We dissected down tensor fascia lata muscle.  Tensor fascia was then divided longitudinally to proceed with direct anterior approach to the hip.  We identified and cauterized circumflex vessels, identified the hip capsule in an  L-type format, finding a large joint effusion once we opened up the hip capsule.  There was large periarticular lateral osteophytes as well.  We placed a Cobra retractor around the medial and lateral femoral neck and made our femoral neck cut with an  oscillating saw just proximal to the lesser trochanter and completed this with an osteotome.  We placed a corkscrew guide in the femoral head and removed the femoral head in its entirety.  It was a large femoral head with a wide area devoid of cartilage.   I then placed a bent Hohmann over the medial acetabular rim and removed periarticular osteophytes around the acetabular rim as well as remnants of the acetabular labrum and other debris.  We then began reaming under direct visualization from a size  small reamer, going up to a size 55, with all reamers placed under direct visualization, the last reamer also placed under direct fluoroscopy, so I could obtain my depth of reaming, my inclination and anteversion.  I  then placed a real DePuy Sector  Gription acetabular component size 56.  We placed apex hole eliminator guide and then went with a 36+4 neutral polyethylene liner based on medialization of the cup and his offset.  Attention was then turned to the femur with the leg externally rotated  120 degrees, extended and adducted. We are to place a Mueller retractor  medially and a Hohmann retractor behind the greater trochanter.  We released lateral joint capsule and then used a box cutting osteotome to enter the femoral canal.  We then used a  rongeur to lateralize.  We then began broaching using the ACTIS broaching system from a size 0 going up to a size 5. With the size 5 in place, we trialed a standard offset femoral neck and a 36+1.5 hip ball, reduced this in the acetabulum and based on  our exam, clinically as well as radiographic, we felt that he needed a high offset stem and just a little bit more length.  We dislocated the hip and removed the trial components.  We placed the real high offset ACTIS femoral component size 5 and the  real 36+5 metal hip ball and again reduced this in acetabulum.  We were pleased with stability, range of motion and assessment mechanically and radiographically in terms of offset and leg length.  We then irrigated the soft tissue with normal saline  solution.  We closed the joint capsule with interrupted #1 Ethibond suture followed by #1 Vicryl to close the tensor fascia.  0 Vicryl was used to close the deep tissue and 2-0 Vicryl was used to close subcutaneous tissue.  The skin was closed with  staples.  An Aquacel dressing was applied.  An in and out catheterization was performed because he hopes to go home today.  He was taken to recovery room in stable condition with all final counts being correct.  No complications noted.  Of note, Rexene Edison, PA-C, did assist during the entire case and assistance was crucial for facilitating every aspect of this case.   SHW D: 03/30/2021 9:47:06 am T: 03/30/2021 10:49:00 pm  JOB: [id number]/ [id number]

## 2021-03-30 NOTE — Anesthesia Preprocedure Evaluation (Signed)
Anesthesia Evaluation  Patient identified by MRN, date of birth, ID band Patient awake    Reviewed: Allergy & Precautions, NPO status , Patient's Chart, lab work & pertinent test results  Airway Mallampati: II  TM Distance: >3 FB Neck ROM: Full    Dental no notable dental hx.    Pulmonary neg pulmonary ROS,    Pulmonary exam normal breath sounds clear to auscultation       Cardiovascular hypertension, Pt. on medications Normal cardiovascular exam Rhythm:Regular Rate:Normal     Neuro/Psych negative neurological ROS  negative psych ROS   GI/Hepatic negative GI ROS, Neg liver ROS,   Endo/Other  Hypothyroidism   Renal/GU negative Renal ROS  negative genitourinary   Musculoskeletal  (+) Arthritis , Osteoarthritis,    Abdominal   Peds negative pediatric ROS (+)  Hematology negative hematology ROS (+)   Anesthesia Other Findings   Reproductive/Obstetrics negative OB ROS                             Anesthesia Physical Anesthesia Plan  ASA: 2  Anesthesia Plan: Spinal   Post-op Pain Management:    Induction: Intravenous  PONV Risk Score and Plan: 1 and Propofol infusion and Treatment may vary due to age or medical condition  Airway Management Planned: Simple Face Mask  Additional Equipment:   Intra-op Plan:   Post-operative Plan:   Informed Consent: I have reviewed the patients History and Physical, chart, labs and discussed the procedure including the risks, benefits and alternatives for the proposed anesthesia with the patient or authorized representative who has indicated his/her understanding and acceptance.     Dental advisory given  Plan Discussed with: CRNA and Surgeon  Anesthesia Plan Comments:         Anesthesia Quick Evaluation

## 2021-03-30 NOTE — Evaluation (Signed)
Physical Therapy Evaluation Patient Details Name: [name] MRN: [id number] DOB: 08-30-1945 Today's Date: 03/30/2021   History of Present Illness  Patient is 75 y.o. male s/p Lt THA anterior approach on 9/9/2 with PMH significant for OA, HTN, hyopthyroidism,  Clinical Impression  [name] is a 75 y.o. male POD 0 s/p Lt THA. Patient reports independence with mobility at baseline. Patient is now limited by functional impairments (see PT problem list below) and requires min guard/supervision for transfers and gait with RW. Patient was able to ambulate ~240 feet with RW and min guard/supervision and cues for safe walker management. Patient educated on safe sequencing for stair mobility and verbalized safe guarding position for people assisting with mobility. Patient instructed in exercises to facilitate ROM and circulation. Patient will benefit from continued skilled PT interventions to address impairments and progress towards PLOF. Patient has met mobility goals at adequate level for discharge home; will continue to follow if pt continues acute stay to progress towards Mod I goals.     Follow Up Recommendations Follow surgeon's recommendation for DC plan and follow-up therapies;Home health PT    Equipment Recommendations  Rolling walker with 5" wheels    Recommendations for Other Services       Precautions / Restrictions Precautions Precautions: Fall Restrictions Weight Bearing Restrictions: No Other Position/Activity Restrictions: WBAT      Mobility  Bed Mobility Overal bed mobility: Needs Assistance Bed Mobility: Supine to Sit;Sit to Supine     Supine to sit: Supervision Sit to supine: Supervision   General bed mobility comments: cues to initiate, no assist needed, no extra time.    Transfers Overall transfer level: Needs assistance Equipment used: Rolling walker (2 wheeled) Transfers: Sit to/from Stand Sit to Stand: Supervision         General transfer  comment: cue for safe hand placement with power up, no overt LOB noted. pt  Ambulation/Gait Ambulation/Gait assistance: Supervision Gait Distance (Feet): 240 Feet Assistive device: Rolling walker (2 wheeled) Gait Pattern/deviations: Step-to pattern;Step-through pattern;Decreased stride length Gait velocity: decr   General Gait Details: cues for step to pattern initially and pt progressed to step through with no overt LOB. cues at start and pt maintained safe proximity to RW.  Stairs Stairs: Yes Stairs assistance: Supervision Stair Management: No rails;Step to pattern;Forwards;With walker Number of Stairs: 2 General stair comments: cus for step sequencing, "up with good down with bad" and use of RW. pt verbalized safe understanding of guarding family to provide.  Wheelchair Mobility    Modified Rankin (Stroke Patients Only)       Balance Overall balance assessment: Mild deficits observed, not formally tested Sitting-balance support: Feet supported Sitting balance-Leahy Scale: Normal     Standing balance support: Bilateral upper extremity supported;During functional activity Standing balance-Leahy Scale: Fair                               Pertinent Vitals/Pain Pain Assessment: 0-10 Pain Score: 4  Pain Location: Lt hip Pain Descriptors / Indicators: Aching;Discomfort Pain Intervention(s): Limited activity within patient's tolerance;Monitored during session;RN gave pain meds during session;Repositioned    Home Living Family/patient expects to be discharged to:: Private residence Living Arrangements: Spouse/significant other Available Help at Discharge: Family Type of Home: House Home Access: Stairs to enter Entrance Stairs-Rails: None Entrance Stairs-Number of Steps: 2 Home Layout: Two level;Full bath on main level;Able to live on main level with bedroom/bathroom Home Equipment: None  Prior Function Level of Independence: Independent          Comments: pt enjoys the shooting range, firearms collecting, restoring old cars, quilting and sewing     Hand Dominance   Dominant Hand: Right    Extremity/Trunk Assessment   Upper Extremity Assessment Upper Extremity Assessment: Overall WFL for tasks assessed    Lower Extremity Assessment Lower Extremity Assessment: Overall WFL for tasks assessed    Cervical / Trunk Assessment Cervical / Trunk Assessment: Normal  Communication   Communication: No difficulties  Cognition Arousal/Alertness: Awake/alert Behavior During Therapy: WFL for tasks assessed/performed Overall Cognitive Status: Within Functional Limits for tasks assessed                                        General Comments      Exercises Total Joint Exercises Ankle Circles/Pumps: AROM;Both;10 reps Quad Sets: AROM;Left;Other reps (comment) Heel Slides: AROM;Left;Other reps (comment) Hip ABduction/ADduction: AROM;Left;Other reps (comment)   Assessment/Plan    PT Assessment Patient needs continued PT services  PT Problem List Decreased strength;Decreased range of motion;Decreased activity tolerance;Decreased balance;Decreased mobility;Decreased knowledge of use of DME;Decreased knowledge of precautions;Pain       PT Treatment Interventions DME instruction;Gait training;Stair training;Functional mobility training;Therapeutic activities;Therapeutic exercise;Balance training;Patient/family education    PT Goals (Current goals can be found in the Care Plan section)  Acute Rehab PT Goals Patient Stated Goal: get back to hobbies PT Goal Formulation: With patient Time For Goal Achievement: 04/06/21 Potential to Achieve Goals: Good    Frequency 7X/week   Barriers to discharge        Co-evaluation               AM-PAC PT "6 Clicks" Mobility  Outcome Measure Help needed turning from your back to your side while in a flat bed without using bedrails?: None Help needed moving from  lying on your back to sitting on the side of a flat bed without using bedrails?: A Little Help needed moving to and from a bed to a chair (including a wheelchair)?: A Little Help needed standing up from a chair using your arms (e.g., wheelchair or bedside chair)?: A Little Help needed to walk in hospital room?: A Little Help needed climbing 3-5 steps with a railing? : A Little 6 Click Score: 19    End of Session Equipment Utilized During Treatment: Gait belt Activity Tolerance: Patient tolerated treatment well Patient left: in bed;with call bell/phone within reach Nurse Communication: Mobility status PT Visit Diagnosis: Muscle weakness (generalized) (M62.81);Difficulty in walking, not elsewhere classified (R26.2)    Time: 9371-6967 PT Time Calculation (min) (ACUTE ONLY): 31 min   Charges:   PT Evaluation $PT Eval Low Complexity: 1 Low PT Treatments $Gait Training: 8-22 mins        Verner Mould, DPT Acute Rehabilitation Services Office [phone] Pager [phone]    Jacques Navy 03/30/2021, 2:56 PM

## 2021-03-30 NOTE — Interval H&P Note (Signed)
History and Physical Interval Note:  The patient understands that he is here today for a left total hip replacement to treat his left hip arthritis.  There has been no interval change in his medical status.  See recent H&P.  The risks and benefits of surgery have been discussed in detail and informed consent is obtained.  03/30/2021 7:04 AM  [name]  has presented today for surgery, with the diagnosis of OSTEOARTHRITIS LEFT HIP.  The various methods of treatment have been discussed with the patient and family. After consideration of risks, benefits and other options for treatment, the patient has consented to  Procedure(s): LEFT TOTAL HIP ARTHROPLASTY ANTERIOR APPROACH (Left) as a surgical intervention.  The patient's history has been reviewed, patient examined, no change in status, stable for surgery.  I have reviewed the patient's chart and labs.  Questions were answered to the patient's satisfaction.     Kathryne Hitch

## 2021-04-02 ENCOUNTER — Other Ambulatory Visit: Payer: Self-pay | Admitting: Orthopaedic Surgery

## 2021-04-02 ENCOUNTER — Telehealth: Payer: Self-pay | Admitting: *Deleted

## 2021-04-02 MED ORDER — ONDANSETRON 4 MG PO TBDP: 4.0000 mg | ORAL_TABLET | Freq: Three times a day (TID) | ORAL | 0 refills | Status: DC | PRN

## 2021-04-02 NOTE — Anesthesia Postprocedure Evaluation (Signed)
Anesthesia Post Note  Patient: [name]  Procedure(s) Performed: LEFT TOTAL HIP ARTHROPLASTY ANTERIOR APPROACH (Left: Hip)     Patient location during evaluation: PACU Anesthesia Type: Spinal Level of consciousness: oriented and awake and alert Pain management: pain level controlled Vital Signs Assessment: post-procedure vital signs reviewed and stable Respiratory status: spontaneous breathing, respiratory function stable and patient connected to nasal cannula oxygen Cardiovascular status: blood pressure returned to baseline and stable Postop Assessment: no headache, no backache and no apparent nausea or vomiting Anesthetic complications: no   No notable events documented.  Last Vitals:  Vitals:   03/30/21 1300 03/30/21 1355  BP: 138/84 130/80  Pulse: 72 75  Resp: 16 16  Temp:  36.6 C  SpO2: 94% 96%    Last Pain:  Vitals:   03/30/21 1355  TempSrc:   PainSc: 4                  Shakedra Beam S

## 2021-04-02 NOTE — Telephone Encounter (Signed)
Patient called requesting something for nausea. Discussed getting some Colace as well as Miralax for bowel movements as he is slightly constipated I believe. Doing well otherwise. Did have some blood tinged urine over the weekend, but no pain/burning/frequency. Noted it was most likely from catheter and to call us back if that changed. Temperature running around 99 consistently. I addressed all of these and told him these are all normal responses after surgery. Thanks.

## 2021-04-04 ENCOUNTER — Telehealth: Payer: Self-pay | Admitting: *Deleted

## 2021-04-04 ENCOUNTER — Other Ambulatory Visit: Payer: Self-pay | Admitting: Orthopaedic Surgery

## 2021-04-04 ENCOUNTER — Encounter (HOSPITAL_COMMUNITY): Payer: Self-pay | Admitting: Orthopaedic Surgery

## 2021-04-04 MED ORDER — OXYCODONE HCL 5 MG PO TABS: 5.0000 mg | ORAL_TABLET | Freq: Four times a day (QID) | ORAL | 0 refills | Status: DC | PRN

## 2021-04-04 NOTE — Telephone Encounter (Signed)
Patient called requesting refill of pain medication. He's doing well. Thanks.

## 2021-04-09 ENCOUNTER — Telehealth: Payer: Self-pay | Admitting: *Deleted

## 2021-04-09 ENCOUNTER — Other Ambulatory Visit: Payer: Self-pay | Admitting: Orthopaedic Surgery

## 2021-04-09 MED ORDER — OXYCODONE HCL 5 MG PO TABS: 5.0000 mg | ORAL_TABLET | Freq: Four times a day (QID) | ORAL | 0 refills | Status: DC | PRN

## 2021-04-09 NOTE — Telephone Encounter (Signed)
Call to patient and updated on refill of medication sent to his pharmacy.

## 2021-04-09 NOTE — Telephone Encounter (Signed)
Call from patient requesting refill of pain medication. Doing well-sees you this week for his 2 week appt. Thanks.

## 2021-04-11 ENCOUNTER — Ambulatory Visit (INDEPENDENT_AMBULATORY_CARE_PROVIDER_SITE_OTHER): Payer: Medicare Other | Admitting: Orthopaedic Surgery

## 2021-04-11 ENCOUNTER — Encounter: Payer: Self-pay | Admitting: Orthopaedic Surgery

## 2021-04-11 DIAGNOSIS — Z96642 Presence of left artificial hip joint: Secondary | ICD-10-CM

## 2021-04-11 NOTE — Progress Notes (Signed)
HPI: [name] returns today 2 weeks status post left total hip arthroplasty.  He is overall doing well.  He has no complaints.  He has been taking aspirin twice daily for DVT prophylaxis.  He continues to take Robaxin oxycodone as needed for pain.  No fevers chills.  Physical exam: Left hip good range of motion without pain.  Left calf supple nontender.  Dorsiflexion plantarflexion left ankle intact.  Ambulates without any assistive device.  Surgical incisions well approximated with staples no signs of infection.  Impression: Status post left total hip arthroplasty 03/30/2021  Plan: Staples removed Steri-Strips applied.  Scar tissue mobilization encouraged.  He will slowly increase his activities as tolerated.  Follow-up with [place] in 1 month sooner if there is any questions concerns.  We will take an 81 mg aspirin once daily for another week and then discontinue as he was on no aspirin prior to surgery.  Questions were encouraged and answered at length by Dr. Magnus Ivan and  myself.

## 2021-04-13 ENCOUNTER — Telehealth: Payer: Self-pay | Admitting: *Deleted

## 2021-04-13 NOTE — Telephone Encounter (Signed)
Ortho bundle 14 day call completed. 

## 2021-04-18 ENCOUNTER — Encounter: Payer: Medicare Other | Admitting: Orthopaedic Surgery

## 2021-05-09 ENCOUNTER — Ambulatory Visit (INDEPENDENT_AMBULATORY_CARE_PROVIDER_SITE_OTHER): Payer: Medicare Other | Admitting: Orthopaedic Surgery

## 2021-05-09 ENCOUNTER — Encounter: Payer: Self-pay | Admitting: Orthopaedic Surgery

## 2021-05-09 DIAGNOSIS — Z96642 Presence of left artificial hip joint: Secondary | ICD-10-CM

## 2021-05-09 NOTE — Progress Notes (Signed)
The patient comes in today at 6 weeks status post a left total hip arthroplasty.  He does have some mild pain but overall reports increased motion and strength and he says he is doing well overall.  He is a very active 75 year old gentleman.  He has had no acute changes in medical status and no fever chills or is listed.  He has good fluid range of motion of his left operative hip without any difficulty at all.  We talked in length in detail about the things he would need to bring him back to [place].  The only restriction he has no running.  The next time I need to see him back in 6 months from now with a standing low AP pelvis and lateral of his left operative hip.

## 2021-05-23 ENCOUNTER — Telehealth: Payer: Self-pay | Admitting: *Deleted

## 2021-05-23 NOTE — Telephone Encounter (Signed)
Attempted Ortho bundle call. No answer and left VM.

## 2021-05-23 NOTE — Telephone Encounter (Signed)
Ortho bundle 30 day call completed. °

## 2021-07-02 ENCOUNTER — Telehealth: Payer: Self-pay | Admitting: *Deleted

## 2021-07-02 NOTE — Telephone Encounter (Signed)
Patient called and is having increased pain in hip area (sounds like around flexors). He is very active and says his pain has really increased almost to before surgery. States weakness has increased as well. Recommended coming in to be seen since he's tried to work through it and it hasn't improved. He is making an appointment sooner with you to see if there is something wrong or other recommendations. He is 90 days post op. Just an FYI.

## 2021-07-04 ENCOUNTER — Encounter: Payer: Self-pay | Admitting: Orthopaedic Surgery

## 2021-07-04 ENCOUNTER — Other Ambulatory Visit: Payer: Self-pay

## 2021-07-04 ENCOUNTER — Ambulatory Visit (INDEPENDENT_AMBULATORY_CARE_PROVIDER_SITE_OTHER): Payer: Medicare Other | Admitting: Orthopaedic Surgery

## 2021-07-04 DIAGNOSIS — Z96642 Presence of left artificial hip joint: Secondary | ICD-10-CM

## 2021-07-04 MED ORDER — METHOCARBAMOL 750 MG PO TABS: 750.0000 mg | ORAL_TABLET | Freq: Three times a day (TID) | ORAL | 1 refills | Status: DC | PRN

## 2021-07-04 MED ORDER — PREDNISONE 50 MG PO TABS: ORAL_TABLET | ORAL | 0 refills | Status: DC

## 2021-07-04 NOTE — Progress Notes (Signed)
The patient is now just over 3 months status post a left total hip arthroplasty.  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  He has been having some left hip pain with lateral movements and getting out of car and getting up from a sitting position.  It is all the proximal thigh where he is hurting.  He denies any fever chills or other medical issues.  The left operative hip moves smoothly and fluidly.  There is certainly some pain around the proximal hip to be expected postoperative but his incision also looks good and there is no worrisome features.  I am going to try Robaxin-750 milligrams to take as needed as a muscle relaxant he will continue alternating ibuprofen and Tylenol.  I would also like to try 5 days of prednisone to see if this will calm down the inflammation that he is experiencing.  We will see him back in 4 weeks from now to see how he is doing overall.  I would like a standing AP pelvis and lateral of his left hip at that visit.

## 2021-07-05 ENCOUNTER — Telehealth: Payer: Self-pay | Admitting: *Deleted

## 2021-07-05 NOTE — Telephone Encounter (Signed)
Patient called and updated on instructions from MD about Prednisone.

## 2021-07-05 NOTE — Telephone Encounter (Signed)
Patient called me this morning and wanted to relay he is already 100% better overnight with the 1 dose of steroid and muscle relaxer. He asked if he should take the complete 5 days of steroid since he feels better, or if he should hold them and take as needed. I explained steroids are usually not prescribed that way, but would check with you. I told him he should complete all 5 days of the steroid to help gid rid of any inflammation and take muscle relaxer as needed. Wanted to make sure that was as intended. Thanks.

## 2021-08-01 ENCOUNTER — Other Ambulatory Visit: Payer: Self-pay

## 2021-08-01 ENCOUNTER — Ambulatory Visit (INDEPENDENT_AMBULATORY_CARE_PROVIDER_SITE_OTHER): Payer: Medicare Other

## 2021-08-01 ENCOUNTER — Ambulatory Visit (INDEPENDENT_AMBULATORY_CARE_PROVIDER_SITE_OTHER): Payer: Medicare Other | Admitting: Orthopaedic Surgery

## 2021-08-01 ENCOUNTER — Encounter: Payer: Self-pay | Admitting: Orthopaedic Surgery

## 2021-08-01 DIAGNOSIS — Z96642 Presence of left artificial hip joint: Secondary | ICD-10-CM

## 2021-08-01 NOTE — Progress Notes (Signed)
The patient is now 4 months status post a left total hip arthroplasty.  At his last visit we did put him on a steroid taper due to inflammation he said that worked greatly.  He is doing well overall.  He says he is about 75% better.  He is 76 years old and very active.  His left operative hip has just a little bit of stiffness throughout his arc of motion but his gait looks good when he walks.  An AP pelvis lateral left hip shows a well-seated total hip arthroplasty with no complicating features.  At this point I will need to see him back for 6 months unless he is having any issues.  We will have just an AP pelvis at that visit.  All questions and concerns were answered and addressed.

## 2021-11-07 ENCOUNTER — Ambulatory Visit: Payer: Medicare Other | Admitting: Orthopaedic Surgery

## 2022-01-29 ENCOUNTER — Ambulatory Visit (INDEPENDENT_AMBULATORY_CARE_PROVIDER_SITE_OTHER): Payer: Medicare Other | Admitting: Orthopaedic Surgery

## 2022-01-29 ENCOUNTER — Encounter: Payer: Self-pay | Admitting: Orthopaedic Surgery

## 2022-01-29 ENCOUNTER — Ambulatory Visit (INDEPENDENT_AMBULATORY_CARE_PROVIDER_SITE_OTHER): Payer: Medicare Other

## 2022-01-29 DIAGNOSIS — Z96642 Presence of left artificial hip joint: Secondary | ICD-10-CM

## 2022-01-29 NOTE — Progress Notes (Signed)
The patient is now 10 months status post a left total hip arthroplasty.  He says he is doing great.  He is 76 years old and very active.  He still reports some numbness with his thigh but otherwise is doing well.  He walks without assistive device and walks with no limp.  His ligaments are equal.  He has good range of motion of his left hip and it feels stable.  An AP pelvis and lateral of the left hip shows a well-seated total hip arthroplasty with no complicating features.  There is only slight heterotopic ossification but this does not cause any blocks or rotation at all.  At this point follow-up is as needed for the left hip.  He understands that if there are any issues at all as it relates that hip to hesitate to see [place].  All questions and concerns were answered and addressed.

## 2022-06-10 ENCOUNTER — Ambulatory Visit (INDEPENDENT_AMBULATORY_CARE_PROVIDER_SITE_OTHER): Payer: Medicare Other | Admitting: Orthopaedic Surgery

## 2022-06-10 ENCOUNTER — Ambulatory Visit (INDEPENDENT_AMBULATORY_CARE_PROVIDER_SITE_OTHER): Payer: Medicare Other

## 2022-06-10 ENCOUNTER — Encounter: Payer: Self-pay | Admitting: Orthopaedic Surgery

## 2022-06-10 DIAGNOSIS — G8929 Other chronic pain: Secondary | ICD-10-CM | POA: Diagnosis not present

## 2022-06-10 DIAGNOSIS — M25562 Pain in left knee: Secondary | ICD-10-CM

## 2022-06-10 NOTE — Progress Notes (Signed)
The patient is well-known to me.  He is a very active 76 year old gentleman where we replaced his left hip in September of last year.  He still has some numbness around the lateral aspect of the left hip which may be permanent at this point.  He comes in today though dealing with left knee pain for about 3 months now.  It is mainly on the medial aspect of his knee with no known injury.  He says his knee pain has lessened quite a bit recently.  There is no instability symptoms or signs and no locking catching.  This mainly was hurting when he was doing some yard work.  Examination of his left knee shows no effusion.  There is slight varus malalignment that is easily correctable.  He has only slight medial joint line tenderness but a negative McMurray's and negative Lachman's exam.  His range of motion is full.  2 views left knee show normal joint space with only slight varus malalignment.  The joint spaces very well-maintained and no significant arthritic findings and no effusion.  Since he is doing better the only thing I recommended really is activity modification and quad strengthening exercises.  If his knee starts to worsen in any way he knows to come back and see [place].  All question concerns were answered and addressed.

## 2022-10-26 ENCOUNTER — Ambulatory Visit
Admission: EM | Admit: 2022-10-26 | Discharge: 2022-10-26 | Disposition: A | Discharge status: Home / Self Care | Payer: Medicare Other | Attending: Nurse Practitioner | Admitting: Nurse Practitioner

## 2022-10-26 DIAGNOSIS — N3001 Acute cystitis with hematuria: Secondary | ICD-10-CM | POA: Diagnosis present

## 2022-10-26 DIAGNOSIS — R300 Dysuria: Secondary | ICD-10-CM | POA: Diagnosis present

## 2022-10-26 LAB — POCT URINALYSIS DIP (MANUAL ENTRY)
Bilirubin, UA: NEGATIVE
Glucose, UA: NEGATIVE mg/dL
Ketones, POC UA: NEGATIVE mg/dL
Nitrite, UA: NEGATIVE
Protein Ur, POC: 100 mg/dL — AB
Spec Grav, UA: 1.02 (ref 1.010–1.025)
Urobilinogen, UA: 0.2 E.U./dL
pH, UA: 6 (ref 5.0–8.0)

## 2022-10-26 MED ORDER — SULFAMETHOXAZOLE-TRIMETHOPRIM 800-160 MG PO TABS
1.0000 | ORAL_TABLET | Freq: Two times a day (BID) | ORAL | 0 refills | Status: DC
Start: 2022-10-26 — End: 2022-10-26

## 2022-10-26 MED ORDER — SULFAMETHOXAZOLE-TRIMETHOPRIM 800-160 MG PO TABS
1.0000 | ORAL_TABLET | Freq: Two times a day (BID) | ORAL | 0 refills | Status: AC
Start: 2022-10-26 — End: 2022-11-05

## 2022-10-26 NOTE — ED Triage Notes (Signed)
Pt states Thursday he developed chills, low grade fever, and pain with urination.   Home interventions: Augmentin (last taken around 0200), hydration

## 2022-10-26 NOTE — Discharge Instructions (Addendum)
The clinic will contact you with results of the urine culture done today Stop Augmentin and start Bactrim twice daily for 10 days Increase fluids  Follow-up with your PCP in 2 days for recheck Please go to the emergency room if you have any worsening symptoms

## 2022-10-26 NOTE — ED Provider Notes (Signed)
[facility]    CSN: [id number] Arrival date & time: 10/26/22  2426      History   Chief Complaint Chief Complaint  Patient presents with   Chills   Fever   Dysuria    HPI [name] is a 77 y.o. male presents for evaluation of dysuria.  Patient reports 2 days of urinary burning with urgency and frequency.  He states yesterday he had low-grade fevers of 99-100 but no additional fever since that time.  Denies any hematuria, nausea/vomiting, flank pain or back pain.  No testicular pain or swelling or abdominal pain.  No STD concern or exposure.  States he had 1 UTI in the past but denies history of recurrent UTIs or history of pyelonephritis.  No difficulty starting or stopping his urine stream.  His wife had some leftover Augmentin so he has taken 5 doses since Thursday with no improvement in his symptoms.  No other over-the-counter medications have been used for symptoms.  No other concerns at this time.   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 Authorizing Provider  amLODipine (NORVASC) 5 MG tablet Take 5 mg by mouth daily. PT TAKES IN THE PM    [provider]  Cholecalciferol (VITAMIN D3 PO) Take 1 tablet by  mouth daily.    [provider]  levothyroxine (SYNTHROID) 75 MCG tablet Take 75 mcg by mouth daily before breakfast. PT TAKES IN THE PM    [provider]  methocarbamol (ROBAXIN) 750 MG tablet Take 1 tablet (750 mg total) by mouth every 8 (eight) hours as needed for muscle spasms. 07/04/21   Kathryne Hitch, MD  Multiple Vitamin (MULTIVITAMIN WITH MINERALS) TABS tablet Take 1 tablet by mouth daily.    [provider]  multivitamin-lutein (OCUVITE-LUTEIN) CAPS capsule Take 1 capsule by mouth daily.    [provider]  Omega-3 Fatty Acids (FISH OIL TRIPLE STRENGTH) 1400 MG CAPS Take 1,400 mg by mouth daily.    [provider]  ondansetron (ZOFRAN ODT) 4 MG disintegrating tablet Take 1 tablet (4 mg total) by mouth every 8 (eight) hours as needed for nausea or vomiting. 04/02/21   Kathryne Hitch, MD  oxyCODONE (ROXICODONE) 5 MG immediate release tablet Take 1-2 tablets (5-10 mg total) by mouth every 6 (six) hours as needed for severe pain. 04/09/21   Kathryne Hitch, MD  Pitavastatin Calcium (LIVALO) 2 MG TABS Take 2 mg by mouth daily. PT TAKES IN THE PM    [provider]  predniSONE (DELTASONE) 50 MG tablet Take one table daily for 5 days. 07/04/21   Doneen Poisson  Y, MD  sulfamethoxazole-trimethoprim (BACTRIM DS) 800-160 MG tablet Take 1 tablet by mouth 2 (two) times daily for 10 days. 10/26/22 11/05/22  Radford PaxMayer, Jodi R, NP    Family History History reviewed. No pertinent family history.  Social History Social History   Tobacco Use   Smoking status: Never   Smokeless tobacco: Never  Vaping Use   Vaping Use: Never used  Substance Use Topics   Alcohol use: Yes    Comment: OCCAS   Drug use: Never     Allergies   Patient has no known allergies.   Review of Systems Review of Systems  Constitutional:  Positive for fever.  Genitourinary:  Positive for dysuria and frequency.     Physical Exam Triage  Vital Signs ED Triage Vitals  Enc Vitals Group     BP 10/26/22 0928 (!) 145/80     Pulse Rate 10/26/22 0928 79     Resp --      Temp 10/26/22 0928 97.6 F (36.4 C)     Temp Source 10/26/22 0928 Oral     SpO2 10/26/22 0928 93 %     Weight --      Height --      Head Circumference --      Peak Flow --      Pain Score 10/26/22 0927 8     Pain Loc --      Pain Edu? --      Excl. in GC? --    No data found.  Updated Vital Signs BP (!) 145/80 (BP Location: Right Arm)   Pulse 79   Temp 97.6 F (36.4 C) (Oral)   SpO2 93%   Visual Acuity Right Eye Distance:   Left Eye Distance:   Bilateral Distance:    Right Eye Near:   Left Eye Near:    Bilateral Near:     Physical Exam Vitals and nursing note reviewed.  Constitutional:      Appearance: Normal appearance.  HENT:     Head: Normocephalic and atraumatic.  Eyes:     Pupils: Pupils are equal, round, and reactive to light.  Cardiovascular:     Rate and Rhythm: Normal rate.  Pulmonary:     Effort: Pulmonary effort is normal.  Abdominal:     Tenderness: There is no right CVA tenderness or left CVA tenderness.  Skin:    General: Skin is warm and dry.  Neurological:     General: No focal deficit present.     Mental Status: He is alert and oriented to person, place, and time.  Psychiatric:        Mood and Affect: Mood normal.        Behavior: Behavior normal.      UC Treatments / Results  Labs (all labs ordered are listed, but only abnormal results are displayed) Labs Reviewed  POCT URINALYSIS DIP (MANUAL ENTRY) - Abnormal; Notable for the following components:      Result Value   Color, UA light yellow (*)    Blood, UA small (*)    Protein Ur, POC =100 (*)    Leukocytes, UA Trace (*)    All other components within normal limits  URINE CULTURE    EKG   Radiology No results found.  Procedures Procedures (including critical care time)  Medications Ordered in UC Medications - No data to  display  Initial Impression / Assessment and Plan / UC Course  I have reviewed the triage vital signs and the  nursing notes.  Pertinent labs & imaging results that were available during my care of the patient were reviewed by me and considered in my medical decision making (see chart for details).     Reviewed symptoms and exam with patient.  No CVAT or nausea/vomiting and patient is afebrile in clinic Will send urine culture and will start Bactrim.  He was advised not to take any more doses of Augmentin Increase fluids Patient follow-up with PCP in 2 days for recheck Strict ER precautions reviewed and patient verbalized understanding Final Clinical Impressions(s) / UC Diagnoses   Final diagnoses:  Dysuria  Acute cystitis with hematuria     Discharge Instructions      The clinic will contact you with results of the urine culture done today Stop Augmentin and start Bactrim twice daily for 10 days Increase fluids  Follow-up with your PCP in 2 days for recheck Please go to the emergency room if you have any worsening symptoms     ED Prescriptions     Medication Sig Dispense Auth. Provider   sulfamethoxazole-trimethoprim (BACTRIM DS) 800-160 MG tablet  (Status: Discontinued) Take 1 tablet by mouth 2 (two) times daily for 10 days. 20 tablet Radford PaxMayer, Jodi R, NP   sulfamethoxazole-trimethoprim (BACTRIM DS) 800-160 MG tablet Take 1 tablet by mouth 2 (two) times daily for 10 days. 20 tablet Radford PaxMayer, Jodi R, NP      PDMP not reviewed this encounter.   Radford PaxMayer, Jodi R, NP 10/26/22 [phone]

## 2022-10-27 LAB — URINE CULTURE: Culture: NO GROWTH

## 2022-11-05 ENCOUNTER — Emergency Department (HOSPITAL_BASED_OUTPATIENT_CLINIC_OR_DEPARTMENT_OTHER)
Admission: EM | Admit: 2022-11-05 | Discharge: 2022-11-05 | Disposition: A | Discharge status: Home / Self Care | Payer: Medicare Other | Attending: Emergency Medicine | Admitting: Emergency Medicine

## 2022-11-05 ENCOUNTER — Other Ambulatory Visit (HOSPITAL_BASED_OUTPATIENT_CLINIC_OR_DEPARTMENT_OTHER): Payer: Self-pay

## 2022-11-05 ENCOUNTER — Emergency Department (HOSPITAL_BASED_OUTPATIENT_CLINIC_OR_DEPARTMENT_OTHER): Payer: Medicare Other

## 2022-11-05 ENCOUNTER — Other Ambulatory Visit: Payer: Self-pay

## 2022-11-05 DIAGNOSIS — I10 Essential (primary) hypertension: Secondary | ICD-10-CM | POA: Diagnosis not present

## 2022-11-05 DIAGNOSIS — R1084 Generalized abdominal pain: Secondary | ICD-10-CM | POA: Insufficient documentation

## 2022-11-05 DIAGNOSIS — R339 Retention of urine, unspecified: Secondary | ICD-10-CM | POA: Diagnosis present

## 2022-11-05 LAB — URINALYSIS, ROUTINE W REFLEX MICROSCOPIC
Bilirubin Urine: NEGATIVE
Glucose, UA: NEGATIVE mg/dL
Hgb urine dipstick: NEGATIVE
Ketones, ur: NEGATIVE mg/dL
Nitrite: NEGATIVE
Protein, ur: NEGATIVE mg/dL
Specific Gravity, Urine: 1.014 (ref 1.005–1.030)
pH: 6 (ref 5.0–8.0)

## 2022-11-05 LAB — CBC
HCT: 45.1 % (ref 39.0–52.0)
Hemoglobin: 14.7 g/dL (ref 13.0–17.0)
MCH: 27 pg (ref 26.0–34.0)
MCHC: 32.6 g/dL (ref 30.0–36.0)
MCV: 82.8 fL (ref 80.0–100.0)
Platelets: 331 10*3/uL (ref 150–400)
RBC: 5.45 MIL/uL (ref 4.22–5.81)
RDW: 13.6 % (ref 11.5–15.5)
WBC: 8.6 10*3/uL (ref 4.0–10.5)
nRBC: 0 % (ref 0.0–0.2)

## 2022-11-05 LAB — BASIC METABOLIC PANEL
Anion gap: 11 (ref 5–15)
BUN: 19 mg/dL (ref 8–23)
CO2: 24 mmol/L (ref 22–32)
Calcium: 9.6 mg/dL (ref 8.9–10.3)
Chloride: 100 mmol/L (ref 98–111)
Creatinine, Ser: 1.22 mg/dL (ref 0.61–1.24)
GFR, Estimated: >60 mL/min (ref >60)
Glucose, Bld: 100 mg/dL — ABNORMAL HIGH (ref 70–99)
Potassium: 4.2 mmol/L (ref 3.5–5.1)
Sodium: 135 mmol/L (ref 135–145)

## 2022-11-05 MED ORDER — IOHEXOL 300 MG/ML  SOLN
100.0000 mL | Freq: Once | INTRAMUSCULAR | Status: AC | PRN
  Administered 2022-11-05: 80 mL via INTRAVENOUS

## 2022-11-05 MED ORDER — CEPHALEXIN 500 MG PO CAPS: 500.0000 mg | ORAL_CAPSULE | Freq: Four times a day (QID) | ORAL | 0 refills | Status: DC

## 2022-11-05 NOTE — ED Notes (Signed)
Pt given Ginger Ale, per [place] [facility] [place].

## 2022-11-05 NOTE — ED Notes (Signed)
Post void bladder scan reading .

## 2022-11-05 NOTE — ED Notes (Signed)
Patient transported to CT 

## 2022-11-05 NOTE — ED Triage Notes (Signed)
Pt arrived POV from PCP. Pt was being seeing by PCP for follow up after being treated for UTI for approx 10 days. Pt finished bactrim yesterday but reports over the past few days he has had increased abd distension, increased urinary frequency with minimal output and PCP is concerned pt may have bladder outlet obstruction with his hx of BPH and current s/s. Pt caox4, ambulatory, NAD, c/o minimal pain in groin. Afebrile.

## 2022-11-05 NOTE — Discharge Instructions (Addendum)
You are seen today in the emergency department due to urinary retention.  Will need to radical Foley catheter until you are seen by urology, please call to schedule an appointment for later this week for evaluation with [facility].  There were signs of infection on your CT scan as well as the urine, start taking the Keflex 1000 mg twice daily for 5 days.  Return to the ED if you have any fevers, vomiting, new or concerning symptoms or malfunction of the catheter.  Otherwise follow-up with urology in the office.

## 2022-11-05 NOTE — ED Provider Notes (Signed)
[place] EMERGENCY DEPARTMENT AT [facility] Provider Note   CSN: [id number] Arrival date & time: 11/05/22  1224     History  Chief Complaint  Patient presents with   Urinary Retention    [name] is a 77 y.o. male.  HPI   Patient with medical history of hypertension, BPH presents to the emergency department due to urinary retention.  Seen earlier today at family medicine doctor, concern for bladder outlet obstruction sent to ED for further evaluation and likely Foley catheter.    Patient tells me symptom started about a week and a half ago, he was having decreased urinary output diagnosed with UTI been on Bactrim for the last 10 days.  Started feeling better until yesterday when he has again been having decreased urinary output and started having abdominal distention with some mild pain.  Endorses nausea but no vomiting.  History of prostate but is never required Foley catheter previously, no history of stones or obvious prostatitis.  Is not having any increased urinary frequency or penile discharge.  No previous abdominal surgeries.  Home Medications Prior to Admission medications   Medication Sig Start Date End Date Taking? Authorizing Provider  cephALEXin (KEFLEX) 500 MG capsule Take 1 capsule (500 mg total) by mouth 4 (four) times daily. 11/05/22  Yes Theron Arista, PA-C  amLODipine (NORVASC) 5 MG tablet Take 5 mg by mouth daily. PT TAKES IN THE PM    [provider]  Cholecalciferol (VITAMIN D3 PO) Take 1 tablet by mouth daily.    [provider]  levothyroxine (SYNTHROID) 75 MCG tablet Take 75 mcg by mouth daily before breakfast. PT TAKES IN THE PM    [provider]  methocarbamol (ROBAXIN) 750 MG tablet Take 1 tablet (750 mg total) by mouth every 8 (eight) hours as needed for muscle spasms. 07/04/21   Kathryne Hitch, MD  Multiple Vitamin (MULTIVITAMIN WITH MINERALS) TABS tablet Take 1 tablet by mouth daily.    [provider]  multivitamin-lutein (OCUVITE-LUTEIN) CAPS capsule Take 1 capsule by mouth daily.    [provider]  Omega-3 Fatty Acids (FISH OIL TRIPLE STRENGTH) 1400 MG CAPS Take 1,400 mg by mouth daily.    [provider]  ondansetron (ZOFRAN ODT) 4 MG disintegrating tablet Take 1 tablet (4 mg total) by mouth every 8 (eight) hours as needed for nausea or vomiting. 04/02/21   Kathryne Hitch, MD  oxyCODONE (ROXICODONE) 5 MG immediate release tablet Take 1-2 tablets (5-10 mg total) by mouth every 6 (six) hours as needed for severe pain. 04/09/21   Kathryne Hitch, MD  Pitavastatin Calcium (LIVALO) 2 MG TABS Take 2 mg by mouth daily. PT TAKES IN THE PM    [provider]  predniSONE (DELTASONE) 50 MG tablet Take one table daily for 5 days. 07/04/21   Kathryne Hitch, MD  sulfamethoxazole-trimethoprim (BACTRIM DS) 800-160 MG tablet Take 1 tablet by mouth 2 (two) times daily for 10 days. 10/26/22 11/05/22  Radford Pax, NP      Allergies    Patient has no known allergies.    Review of Systems   Review of Systems  Physical Exam Updated Vital Signs BP (!) 144/84 (BP Location: Right Arm)   Pulse 63   Temp 98.2 F (36.8 C) (Oral)   Resp 18   Ht 5\' 11"  (1.803 m)   Wt 107 kg   SpO2 98%   BMI 32.92 kg/m  Physical Exam Vitals and nursing  note reviewed. Exam conducted with a chaperone present.  Constitutional:      Appearance: Normal appearance.  HENT:     Head: Normocephalic and atraumatic.  Eyes:     General: No scleral icterus.       Right eye: No discharge.        Left eye: No discharge.     Extraocular Movements: Extraocular movements intact.     Pupils: Pupils are equal, round, and reactive to light.  Cardiovascular:     Rate and Rhythm: Normal rate and regular rhythm.     Pulses: Normal pulses.     Heart sounds: Normal heart sounds. No murmur heard.    No friction rub. No gallop.  Pulmonary:     Effort: Pulmonary effort is  normal. No respiratory distress.     Breath sounds: Normal breath sounds.  Abdominal:     General: Abdomen is flat. Bowel sounds are normal. There is distension.     Palpations: Abdomen is soft.     Tenderness: There is generalized abdominal tenderness.  Skin:    General: Skin is warm and dry.     Coloration: Skin is not jaundiced.  Neurological:     Mental Status: He is alert. Mental status is at baseline.     Coordination: Coordination normal.     ED Results / Procedures / Treatments   Labs (all labs ordered are listed, but only abnormal results are displayed) Labs Reviewed  URINALYSIS, ROUTINE W REFLEX MICROSCOPIC - Abnormal; Notable for the following components:      Result Value   Leukocytes,Ua TRACE (*)    Bacteria, UA RARE (*)    All other components within normal limits  BASIC METABOLIC PANEL - Abnormal; Notable for the following components:   Glucose, Bld 100 (*)    All other components within normal limits  CBC    EKG None  Radiology CT ABDOMEN PELVIS W CONTRAST  Result Date: 11/05/2022 CLINICAL DATA:  Recent treatment for UTI with increasing abdominal distention and urinary frequency. EXAM: CT ABDOMEN AND PELVIS WITH CONTRAST TECHNIQUE: Multidetector CT imaging of the abdomen and pelvis was performed using the standard protocol following bolus administration of intravenous contrast. RADIATION DOSE REDUCTION: This exam was performed according to the departmental dose-optimization program which includes automated exposure control, adjustment of the mA and/or kV according to patient size and/or use of iterative reconstruction technique. CONTRAST:  80mL OMNIPAQUE IOHEXOL 300 MG/ML  SOLN COMPARISON:  None Available. FINDINGS: Lower chest: There is some linear opacity lung bases likely scar or atelectasis. No pleural effusion. Hepatobiliary: No focal liver abnormality is seen. No gallstones, gallbladder wall thickening, or biliary dilatation. Pancreas: Unremarkable. No  pancreatic ductal dilatation or surrounding inflammatory changes. Spleen: Normal in size without focal abnormality.  Small splenules. Adrenals/Urinary Tract: Adrenal gland is preserved. No right-sided enhancing renal mass or collecting system dilatation. There is a tiny Bosniak 1 cyst posteromedial. No specific imaging follow-up. The right ureter has a normal course and caliber down to the bladder. The bladder is contracted with a Foley catheter. No enhancing left-sided renal mass but there is mild collecting system dilatation and some adjacent stranding. The ectatic ureter seen down to the level of the bladder. No ureteral stone seen. There is a punctate lower pole nonobstructing left-sided renal stone as seen best on coronal image 48 of series 5. No delayed enhancement or excretion from the left kidney. Stomach/Bowel: On this non oral contrast exam, large bowel has a normal course and caliber.  Diffuse colonic stool. The appendix is not clearly seen in the right lower quadrant but no pericecal stranding or fluid. Few sigmoid colon diverticula. Stomach and small bowel are nondilated. Vascular/Lymphatic: Normal caliber aorta and IVC with some vascular calcifications. No specific abnormal lymph node enlargement identified in the abdomen and pelvis. Reproductive: Enlarged prostate with mass effect along the base of the bladder. Other: There is some pelvic stranding seen near the margin of the prostate and bladder. Slightly more on the left than right. Musculoskeletal: Significant streak artifact related to the patient's left hip arthroplasty obscuring portions of the left hemipelvis. Degenerative changes are seen scattered in the spine and pelvis. Synovial herniation pit along the right femoral neck. IMPRESSION: Punctate nonobstructing left-sided renal stone. There is mild left-sided renal collecting system dilatation down to the bladder without a ureteral stone. Etiology is uncertain. Recommend further workup when  appropriate if there is no known history such as a delayed contrast CT to evaluate the urothelium. Enlarged prostate with mass effect along the bladder. There is Foley catheter in place with the contracted urinary bladder. There is also some stranding in the low pelvis around the prostate and bladder. Etiology is uncertain. Please correlate for any clinical signs of infection. Electronically Signed   By: Karen Kays M.D.   On: 11/05/2022 16:24    Procedures Procedures    Medications Ordered in ED Medications  iohexol (OMNIPAQUE) 300 MG/ML solution 100 mL (80 mLs Intravenous Contrast Given 11/05/22 1553)    ED Course/ Medical Decision Making/ A&P                             Medical Decision Making Amount and/or Complexity of Data Reviewed Labs: ordered. Radiology: ordered.  Risk Prescription drug management.   This is a 77 year old male with history of hypertension, hypothyroid, large prostate followed by urology presenting to the emergency department due to urinary retention/abdominal distention.  Differential is broad and includes but outlet obstruction, UTI, sepsis, colitis, nephrolithiasis, BPH, AKI, electrolyte abnormality.  Reviewed external medical records including previous visit with his family doctor. Wife is independent historian.   Laboratory workup is notable for no leukocytosis or anemia, no renal impairment or AKI, no gross electrolyte derangement.  Urine is with very slight trace leukocyturia. CT abdomen is notable for nonobstructing left renal stone, dilation to the collecting left ureter which is probably due to urinary retention.  He also has a large prostatomegaly.  Patient's postvoid was 800+, Foley catheter inserted with greater than 1200 output. Suspect patient's urinary tension is secondary to prostatomegaly/BPH.  Urology follow-up was provided, will cover with Keflex given there was some stranding on the CT abdomen concerning for unresolved cystitis and he does  understand Bactrim a few days ago.  Due to precautions discussed the patient with his understanding of the plan.  Clinically, he has no SIRS criteria I do not suspect sepsis.        Final Clinical Impression(s) / ED Diagnoses Final diagnoses:  Urinary retention    Rx / DC Orders ED Discharge Orders          Ordered    Ambulatory referral to Urology        11/05/22 1644    cephALEXin (KEFLEX) 500 MG capsule  4 times daily        11/05/22 1644              Theron Arista, [place] 11/05/22 1746  Ernie Avena, MD 11/05/22 [phone]

## 2022-12-19 ENCOUNTER — Other Ambulatory Visit: Payer: Self-pay | Admitting: Urology

## 2022-12-23 ENCOUNTER — Other Ambulatory Visit: Payer: Self-pay

## 2022-12-23 ENCOUNTER — Encounter (HOSPITAL_BASED_OUTPATIENT_CLINIC_OR_DEPARTMENT_OTHER): Payer: Self-pay | Admitting: Urology

## 2022-12-23 NOTE — Progress Notes (Signed)
Spoke w/ via phone for pre-op interview---pt Lab needs dos----I stat, ekg               Lab results------none COVID test -----patient states asymptomatic no test needed Arrive at -------800 am 01-10-2023 NPO after MN NO Solid Food.  Clear liquids from MN until---700 am Med rec completed Medications to take morning of surgery -----none Diabetic medication -----n/a Patient instructed no nail polish to be worn day of surgery Patient instructed to bring photo id and insurance card day of surgery Patient aware to have Driver (ride ) / caregiver    [name] for 24 hours after surgery  Patient Special Instructions -----pt given overnight stay instructions Pre-Op special Instructions -----none Patient verbalized understanding of instructions that were given at this phone interview. Patient denies shortness of breath, chest pain, fever, cough at this phone interview.

## 2023-01-10 ENCOUNTER — Ambulatory Visit (HOSPITAL_BASED_OUTPATIENT_CLINIC_OR_DEPARTMENT_OTHER)
Admission: RE | Admit: 2023-01-10 | Discharge: 2023-01-11 | Disposition: A | Discharge status: Home / Self Care | Payer: Medicare Other | Attending: Urology | Admitting: Urology

## 2023-01-10 ENCOUNTER — Ambulatory Visit (HOSPITAL_BASED_OUTPATIENT_CLINIC_OR_DEPARTMENT_OTHER): Payer: Medicare Other | Admitting: Anesthesiology

## 2023-01-10 ENCOUNTER — Encounter (HOSPITAL_BASED_OUTPATIENT_CLINIC_OR_DEPARTMENT_OTHER)
Admission: RE | Disposition: A | Discharge status: Home / Self Care | Payer: Self-pay | Source: Home / Self Care | Attending: Urology

## 2023-01-10 ENCOUNTER — Encounter (HOSPITAL_BASED_OUTPATIENT_CLINIC_OR_DEPARTMENT_OTHER): Payer: Self-pay | Admitting: Urology

## 2023-01-10 ENCOUNTER — Other Ambulatory Visit: Payer: Self-pay

## 2023-01-10 DIAGNOSIS — N401 Enlarged prostate with lower urinary tract symptoms: Secondary | ICD-10-CM

## 2023-01-10 DIAGNOSIS — R338 Other retention of urine: Secondary | ICD-10-CM

## 2023-01-10 DIAGNOSIS — N138 Other obstructive and reflux uropathy: Secondary | ICD-10-CM | POA: Diagnosis not present

## 2023-01-10 DIAGNOSIS — E039 Hypothyroidism, unspecified: Secondary | ICD-10-CM

## 2023-01-10 DIAGNOSIS — I10 Essential (primary) hypertension: Secondary | ICD-10-CM

## 2023-01-10 DIAGNOSIS — E785 Hyperlipidemia, unspecified: Secondary | ICD-10-CM | POA: Insufficient documentation

## 2023-01-10 DIAGNOSIS — N529 Male erectile dysfunction, unspecified: Secondary | ICD-10-CM | POA: Insufficient documentation

## 2023-01-10 DIAGNOSIS — N411 Chronic prostatitis: Secondary | ICD-10-CM | POA: Diagnosis not present

## 2023-01-10 DIAGNOSIS — Z96642 Presence of left artificial hip joint: Secondary | ICD-10-CM | POA: Diagnosis not present

## 2023-01-10 DIAGNOSIS — Z01818 Encounter for other preprocedural examination: Secondary | ICD-10-CM

## 2023-01-10 DIAGNOSIS — N400 Enlarged prostate without lower urinary tract symptoms: Secondary | ICD-10-CM | POA: Diagnosis present

## 2023-01-10 DIAGNOSIS — N1330 Unspecified hydronephrosis: Secondary | ICD-10-CM | POA: Diagnosis not present

## 2023-01-10 HISTORY — PX: TRANSURETHRAL RESECTION OF PROSTATE: SHX73

## 2023-01-10 LAB — BASIC METABOLIC PANEL
Anion gap: 6 (ref 5–15)
BUN: 20 mg/dL (ref 8–23)
CO2: 25 mmol/L (ref 22–32)
Calcium: 8.2 mg/dL — ABNORMAL LOW (ref 8.9–10.3)
Chloride: 103 mmol/L (ref 98–111)
Creatinine, Ser: 1.13 mg/dL (ref 0.61–1.24)
GFR, Estimated: >60 mL/min (ref >60)
Glucose, Bld: 129 mg/dL — ABNORMAL HIGH (ref 70–99)
Potassium: 3.6 mmol/L (ref 3.5–5.1)
Sodium: 134 mmol/L — ABNORMAL LOW (ref 135–145)

## 2023-01-10 LAB — CBC
HCT: 43 % (ref 39.0–52.0)
Hemoglobin: 14.2 g/dL (ref 13.0–17.0)
MCH: 27.9 pg (ref 26.0–34.0)
MCHC: 33 g/dL (ref 30.0–36.0)
MCV: 84.5 fL (ref 80.0–100.0)
Platelets: 190 10*3/uL (ref 150–400)
RBC: 5.09 MIL/uL (ref 4.22–5.81)
RDW: 13.5 % (ref 11.5–15.5)
WBC: 7 10*3/uL (ref 4.0–10.5)
nRBC: 0 % (ref 0.0–0.2)

## 2023-01-10 LAB — POCT I-STAT, CHEM 8
BUN: 21 mg/dL (ref 8–23)
Calcium, Ion: 1.06 mmol/L — ABNORMAL LOW (ref 1.15–1.40)
Chloride: 102 mmol/L (ref 98–111)
Creatinine, Ser: 1 mg/dL (ref 0.61–1.24)
Glucose, Bld: 109 mg/dL — ABNORMAL HIGH (ref 70–99)
HCT: 47 % (ref 39.0–52.0)
Hemoglobin: 16 g/dL (ref 13.0–17.0)
Potassium: 3.5 mmol/L (ref 3.5–5.1)
Sodium: 139 mmol/L (ref 135–145)
TCO2: 26 mmol/L (ref 22–32)

## 2023-01-10 SURGERY — TURP (TRANSURETHRAL RESECTION OF PROSTATE)
Anesthesia: General | Site: Prostate

## 2023-01-10 MED ORDER — ACETAMINOPHEN 500 MG PO TABS
ORAL_TABLET | ORAL | Status: AC
  Filled 2023-01-10: qty 2

## 2023-01-10 MED ORDER — DOCUSATE SODIUM 100 MG PO CAPS: 100.0000 mg | ORAL_CAPSULE | Freq: Every day | ORAL | 0 refills | Status: AC | PRN

## 2023-01-10 MED ORDER — DIPHENHYDRAMINE HCL 50 MG/ML IJ SOLN: 12.5000 mg | Freq: Four times a day (QID) | INTRAMUSCULAR | Status: DC | PRN

## 2023-01-10 MED ORDER — OXYCODONE HCL 5 MG PO TABS: 5.0000 mg | ORAL_TABLET | Freq: Once | ORAL | Status: DC | PRN

## 2023-01-10 MED ORDER — ONDANSETRON HCL 4 MG/2ML IJ SOLN: 4.0000 mg | INTRAMUSCULAR | Status: DC | PRN

## 2023-01-10 MED ORDER — LIDOCAINE HCL (CARDIAC) PF 100 MG/5ML IV SOSY
PREFILLED_SYRINGE | INTRAVENOUS | Status: DC | PRN
  Administered 2023-01-10: 60 mg via INTRAVENOUS

## 2023-01-10 MED ORDER — ONDANSETRON HCL 4 MG/2ML IJ SOLN
INTRAMUSCULAR | Status: DC | PRN
  Administered 2023-01-10: 4 mg via INTRAVENOUS

## 2023-01-10 MED ORDER — OXYBUTYNIN CHLORIDE ER 10 MG PO TB24
10.0000 mg | ORAL_TABLET | Freq: Every day | ORAL | Status: DC
  Administered 2023-01-10: 10 mg via ORAL

## 2023-01-10 MED ORDER — CEFAZOLIN SODIUM-DEXTROSE 2-4 GM/100ML-% IV SOLN
INTRAVENOUS | Status: AC
  Filled 2023-01-10: qty 100

## 2023-01-10 MED ORDER — FENTANYL CITRATE (PF) 100 MCG/2ML IJ SOLN
INTRAMUSCULAR | Status: DC | PRN
  Administered 2023-01-10: 50 ug via INTRAVENOUS
  Administered 2023-01-10 (×2): 25 ug via INTRAVENOUS
  Administered 2023-01-10 (×2): 50 ug via INTRAVENOUS

## 2023-01-10 MED ORDER — POTASSIUM CHLORIDE IN NACL 20-0.45 MEQ/L-% IV SOLN
INTRAVENOUS | Status: DC
  Filled 2023-01-10 (×2): qty 1000

## 2023-01-10 MED ORDER — OXYCODONE-ACETAMINOPHEN 5-325 MG PO TABS: 1.0000 | ORAL_TABLET | ORAL | 0 refills | Status: AC | PRN

## 2023-01-10 MED ORDER — MORPHINE SULFATE (PF) 4 MG/ML IV SOLN: 2.0000 mg | INTRAVENOUS | Status: DC | PRN

## 2023-01-10 MED ORDER — LACTATED RINGERS IV SOLN
INTRAVENOUS | Status: DC
  Administered 2023-01-10: 1000 mL via INTRAVENOUS

## 2023-01-10 MED ORDER — ATORVASTATIN CALCIUM 20 MG PO TABS
20.0000 mg | ORAL_TABLET | Freq: Every evening | ORAL | Status: DC
  Administered 2023-01-10: 20 mg via ORAL
  Filled 2023-01-10: qty 1

## 2023-01-10 MED ORDER — SODIUM CHLORIDE 0.9 % IV SOLN: 250.0000 mL | INTRAVENOUS | Status: DC | PRN

## 2023-01-10 MED ORDER — PHENYLEPHRINE HCL (PRESSORS) 10 MG/ML IV SOLN
INTRAVENOUS | Status: DC | PRN
  Administered 2023-01-10 (×5): 80 ug via INTRAVENOUS

## 2023-01-10 MED ORDER — STERILE WATER FOR IRRIGATION IR SOLN
Status: DC | PRN
  Administered 2023-01-10: 500 mL

## 2023-01-10 MED ORDER — PHENYLEPHRINE 80 MCG/ML (10ML) SYRINGE FOR IV PUSH (FOR BLOOD PRESSURE SUPPORT)
PREFILLED_SYRINGE | INTRAVENOUS | Status: AC
  Filled 2023-01-10: qty 10

## 2023-01-10 MED ORDER — POLYETHYLENE GLYCOL 3350 17 G PO PACK: 17.0000 g | PACK | Freq: Every day | ORAL | Status: DC | PRN

## 2023-01-10 MED ORDER — ZOLPIDEM TARTRATE 5 MG PO TABS: 5.0000 mg | ORAL_TABLET | Freq: Every evening | ORAL | Status: DC | PRN

## 2023-01-10 MED ORDER — FENTANYL CITRATE (PF) 100 MCG/2ML IJ SOLN: 25.0000 ug | INTRAMUSCULAR | Status: DC | PRN

## 2023-01-10 MED ORDER — DEXAMETHASONE SODIUM PHOSPHATE 4 MG/ML IJ SOLN
INTRAMUSCULAR | Status: DC | PRN
  Administered 2023-01-10: 5 mg via INTRAVENOUS

## 2023-01-10 MED ORDER — OXYCODONE-ACETAMINOPHEN 5-325 MG PO TABS: 1.0000 | ORAL_TABLET | ORAL | Status: DC | PRN

## 2023-01-10 MED ORDER — LEVOTHYROXINE SODIUM 75 MCG PO TABS
75.0000 ug | ORAL_TABLET | Freq: Every day | ORAL | Status: DC
  Filled 2023-01-10: qty 1

## 2023-01-10 MED ORDER — FENTANYL CITRATE (PF) 100 MCG/2ML IJ SOLN
INTRAMUSCULAR | Status: AC
  Filled 2023-01-10: qty 2

## 2023-01-10 MED ORDER — ONDANSETRON HCL 4 MG/2ML IJ SOLN
INTRAMUSCULAR | Status: AC
  Filled 2023-01-10: qty 2

## 2023-01-10 MED ORDER — PROPOFOL 10 MG/ML IV BOLUS
INTRAVENOUS | Status: AC
  Filled 2023-01-10: qty 20

## 2023-01-10 MED ORDER — TRIPLE ANTIBIOTIC 3.5-400-5000 EX OINT: 1.0000 | TOPICAL_OINTMENT | Freq: Three times a day (TID) | CUTANEOUS | Status: DC | PRN

## 2023-01-10 MED ORDER — ACETAMINOPHEN 500 MG PO TABS
1000.0000 mg | ORAL_TABLET | Freq: Once | ORAL | Status: AC
  Administered 2023-01-10: 1000 mg via ORAL

## 2023-01-10 MED ORDER — LIDOCAINE HCL (PF) 2 % IJ SOLN
INTRAMUSCULAR | Status: AC
  Filled 2023-01-10: qty 5

## 2023-01-10 MED ORDER — DEXAMETHASONE SODIUM PHOSPHATE 10 MG/ML IJ SOLN
INTRAMUSCULAR | Status: AC
  Filled 2023-01-10: qty 1

## 2023-01-10 MED ORDER — CEFAZOLIN SODIUM-DEXTROSE 2-4 GM/100ML-% IV SOLN
2.0000 g | INTRAVENOUS | Status: AC
  Administered 2023-01-10: 2 g via INTRAVENOUS

## 2023-01-10 MED ORDER — SODIUM CHLORIDE 0.9% FLUSH: 3.0000 mL | INTRAVENOUS | Status: DC | PRN

## 2023-01-10 MED ORDER — SODIUM CHLORIDE 0.9 % IR SOLN: 3000.0000 mL | Status: DC

## 2023-01-10 MED ORDER — ACETAMINOPHEN 325 MG PO TABS: 650.0000 mg | ORAL_TABLET | ORAL | Status: DC | PRN

## 2023-01-10 MED ORDER — SODIUM CHLORIDE 0.9% FLUSH: 3.0000 mL | Freq: Two times a day (BID) | INTRAVENOUS | Status: DC

## 2023-01-10 MED ORDER — AMLODIPINE BESYLATE 5 MG PO TABS
5.0000 mg | ORAL_TABLET | Freq: Every evening | ORAL | Status: DC
  Administered 2023-01-10: 5 mg via ORAL
  Filled 2023-01-10: qty 1

## 2023-01-10 MED ORDER — ONDANSETRON HCL 4 MG/2ML IJ SOLN: 4.0000 mg | Freq: Once | INTRAMUSCULAR | Status: DC | PRN

## 2023-01-10 MED ORDER — EPHEDRINE SULFATE (PRESSORS) 50 MG/ML IJ SOLN
INTRAMUSCULAR | Status: DC | PRN
  Administered 2023-01-10 (×2): 10 mg via INTRAVENOUS
  Administered 2023-01-10: 5 mg via INTRAVENOUS

## 2023-01-10 MED ORDER — OXYBUTYNIN CHLORIDE ER 10 MG PO TB24
ORAL_TABLET | ORAL | Status: AC
  Filled 2023-01-10: qty 1

## 2023-01-10 MED ORDER — EPHEDRINE 5 MG/ML INJ
INTRAVENOUS | Status: AC
  Filled 2023-01-10: qty 5

## 2023-01-10 MED ORDER — ADULT MULTIVITAMIN W/MINERALS CH
1.0000 | ORAL_TABLET | Freq: Every day | ORAL | Status: DC
  Administered 2023-01-10: 1 via ORAL
  Filled 2023-01-10: qty 1

## 2023-01-10 MED ORDER — PROPOFOL 10 MG/ML IV BOLUS
INTRAVENOUS | Status: DC | PRN
  Administered 2023-01-10: 150 mg via INTRAVENOUS

## 2023-01-10 MED ORDER — 0.9 % SODIUM CHLORIDE (POUR BTL) OPTIME
TOPICAL | Status: DC | PRN
  Administered 2023-01-10: 500 mL

## 2023-01-10 MED ORDER — DIPHENHYDRAMINE HCL 12.5 MG/5ML PO ELIX: 12.5000 mg | ORAL_SOLUTION | Freq: Four times a day (QID) | ORAL | Status: DC | PRN

## 2023-01-10 MED ORDER — SODIUM CHLORIDE 0.9 % IR SOLN
Status: DC | PRN
  Administered 2023-01-10 (×6): 6000 mL

## 2023-01-10 MED ORDER — OXYCODONE HCL 5 MG/5ML PO SOLN: 5.0000 mg | Freq: Once | ORAL | Status: DC | PRN

## 2023-01-10 SURGICAL SUPPLY — 29 items
BAG DRAIN URO-CYSTO SKYTR STRL (DRAIN) ×1 IMPLANT
BAG DRN RND TRDRP ANRFLXCHMBR (UROLOGICAL SUPPLIES) ×1
BAG DRN UROCATH (DRAIN) ×1
BAG URINE DRAIN 2000ML AR STRL (UROLOGICAL SUPPLIES) ×1 IMPLANT
BAND INSRT 18 STRL LF DISP RB (MISCELLANEOUS) ×1
BAND RUBBER #18 3X1/16 STRL (MISCELLANEOUS) IMPLANT
CATH FOLEY 3WAY 30CC 22FR (CATHETERS) ×1 IMPLANT
CLOTH BEACON ORANGE TIMEOUT ST (SAFETY) ×1 IMPLANT
GLOVE BIO SURGEON STRL SZ 6.5 (GLOVE) IMPLANT
GLOVE BIO SURGEON STRL SZ7 (GLOVE) ×1 IMPLANT
GLOVE BIOGEL PI IND STRL 6.5 (GLOVE) IMPLANT
GOWN STRL REUS W/ TWL LRG LVL3 (GOWN DISPOSABLE) IMPLANT
GOWN STRL REUS W/TWL LRG LVL3 (GOWN DISPOSABLE) ×2 IMPLANT
HOLDER FOLEY CATH W/STRAP (MISCELLANEOUS) ×1 IMPLANT
IV NS IRRIG 3000ML ARTHROMATIC (IV SOLUTION) ×2 IMPLANT
KIT TURNOVER CYSTO (KITS) ×1 IMPLANT
LOOP CUT BIPOLAR 24F LRG (ELECTROSURGICAL) ×1 IMPLANT
MANIFOLD NEPTUNE II (INSTRUMENTS) ×1 IMPLANT
NS IRRIG 500ML POUR BTL (IV SOLUTION) IMPLANT
PACK CYSTO (CUSTOM PROCEDURE TRAY) ×1 IMPLANT
PIN SAFETY STERILE (MISCELLANEOUS) IMPLANT
SLEEVE SCD COMPRESS KNEE MED (STOCKING) ×1 IMPLANT
SYR 10ML LL (SYRINGE) IMPLANT
SYR 30ML LL (SYRINGE) ×1 IMPLANT
SYR TOOMEY IRRIG 70ML (MISCELLANEOUS) ×1
SYRINGE TOOMEY IRRIG 70ML (MISCELLANEOUS) ×1 IMPLANT
TUBE CONNECTING 12X1/4 (SUCTIONS) ×1 IMPLANT
TUBING UROLOGY SET (TUBING) ×1 IMPLANT
WATER STERILE IRR 500ML POUR (IV SOLUTION) IMPLANT

## 2023-01-10 NOTE — Anesthesia Procedure Notes (Signed)
Procedure Name: LMA Insertion Date/Time: 01/10/2023 9:31 AM  Performed by: Jessica Priest, CRNAPre-anesthesia Checklist: Patient identified, Emergency Drugs available, Suction available, Patient being monitored and Timeout performed Patient Re-evaluated:Patient Re-evaluated prior to induction Oxygen Delivery Method: Circle system utilized Preoxygenation: Pre-oxygenation with 100% oxygen Induction Type: IV induction Ventilation: Mask ventilation without difficulty LMA: LMA inserted LMA Size: 5.0 Number of attempts: 1 Airway Equipment and Method: Bite block Placement Confirmation: positive ETCO2, breath sounds checked- equal and bilateral and CO2 detector Tube secured with: Tape Dental Injury: Teeth and Oropharynx as per pre-operative assessment

## 2023-01-10 NOTE — H&P (Signed)
Office Visit Report     12/10/2022   --------------------------------------------------------------------------------   [name]  MRN: [id number]  DOB: 11-Oct-1945, 77 year old Male  SSN:    PRIMARY CARE:  Georgann Housekeeper, MD  PRIMARY CARE FAX:  [phone]  REFERRING:  Buzzy Han, PA  PROVIDER:  Jettie Salinas, M.D.  LOCATION:  [facility] [phone]     --------------------------------------------------------------------------------   CC/HPI: [name] is a 77 year old male who is seen in consultation today with BPH with urinary retention.   1. BPH with urinary retention:  -He presented the ED in 10/2022 with urinary retention was found to have 1.2L urine in his bladder. He is also found to have UTI was placed on Bactrim. CT A/P 11/05/2022 revealed 4.7 x 6.7 x 5.7 cm prostate gland to roughly 93 cc prostate. He failed a void trial on 11/28/2022 and 12/10/2022. He is taking tamsulosin 0.8 mg daily. Prior to this, he said he had a weaker flow stream. He was seen a urologist in [place], [place].   #2. Elevated PSA: This is a history of an elevated PSA and is undergone 2 prior negative biopsies in [place]. These records are not available for my review.   3. Erectile dysfunction: He has difficulty obtaining maintain erection satisfactory for intercourse. He has tried with tadalafil and sildenafil with no significant benefit. He estimates about 50% rigidity. He has not interested in ICI or IPP at this time.   #4. Left hydronephrosis: CT A/P 11/08/2022 with mild left hydroureteronephrosis. Denies smoking history. Denies family history of urologic malignancy. Denies left-sided flank pain.   Patient currently denies fever, chills, sweats, nausea, vomiting, abdominal or flank pain, gross hematuria or dysuria.   He has a past medical history of left hip replacement, hypertension and hyperlipidemia. He denies cardiac or pulmonary history. He is very active.      ALLERGIES: No Allergies    MEDICATIONS: Macrobid 100 mg capsule 1 capsule PO BID  Tamsulosin Hcl 0.4 mg capsule 1 capsule PO Q HS  Amlodipine Besylate 5 mg tablet 1 tablet PO Daily  Fish Oil  Levothyroxine Sodium 100 mcg tablet 1 tablet PO Daily  Livalo 2 mg tablet  Multivitamin  Vitamin D3     GU PSH: No GU PSH    NON-GU PSH: Hip Replacement, Left     GU PMH: Acute Cystitis/UTI - 12/09/2022 BPH w/LUTS - 12/09/2022 Urinary Retention - 12/09/2022, - 11/28/2022, - 11/20/2022    NON-GU PMH: Hypercholesterolemia Hypertension    FAMILY HISTORY: No Family History    SOCIAL HISTORY: Marital Status: Married Ethnicity: Not Hispanic Or Latino; Race: White Current Smoking Status: Patient has never smoked.   Tobacco Use Assessment Completed: Used Tobacco in last 30 days? Does drink.  Drinks 2 caffeinated drinks per day.    REVIEW OF SYSTEMS:    GU Review Male:   Patient denies frequent urination, hard to postpone urination, burning/ pain with urination, get up at night to urinate, leakage of urine, stream starts and stops, trouble starting your stream, have to strain to urinate , erection problems, and penile pain.  Gastrointestinal (Upper):   Patient denies nausea, vomiting, and indigestion/ heartburn.  Gastrointestinal (Lower):   Patient denies diarrhea and constipation.  Constitutional:   Patient denies fever, night sweats, weight loss, and fatigue.  Skin:   Patient denies skin rash/ lesion and itching.  Eyes:   Patient denies blurred vision and double vision.  Ears/ Nose/ Throat:   Patient denies sinus  problems and sore throat.  Hematologic/Lymphatic:   Patient denies swollen glands and easy bruising.  Cardiovascular:   Patient denies leg swelling and chest pains.  Respiratory:   Patient denies cough and shortness of breath.  Endocrine:   Patient denies excessive thirst.  Musculoskeletal:   Patient denies back pain and joint pain.  Neurological:   Patient denies headaches  and dizziness.  Psychologic:   Patient denies depression and anxiety.   VITAL SIGNS: None   MULTI-SYSTEM PHYSICAL EXAMINATION:    Constitutional: Well-nourished. No physical deformities. Normally developed. Good grooming.  Respiratory: No labored breathing, no use of accessory muscles.   Cardiovascular: Normal temperature, normal extremity pulses, no swelling, no varicosities.  Gastrointestinal: No mass, no tenderness, no rigidity, non obese abdomen.     Complexity of Data:  Source Of History:  Patient, Medical Record Summary  Lab Test Review:   PSA  Records Review:   AUA Symptom Score, Previous Doctor Records, Previous Patient Records  Urine Test Review:   Urinalysis  Urodynamics Review:   Review Bladder Scan  X-Ray Review: C.T. Abdomen/Pelvis: Reviewed Films. Reviewed Report. Discussed With Patient.     PROCEDURES:         Flexible Cystoscopy - 52000  Risks, benefits, and some of the potential complications of the procedure were discussed at length with the patient including infection, bleeding, voiding discomfort, urinary retention, fever, chills, sepsis, and others. All questions were answered. Informed consent was obtained. Antibiotic prophylaxis was given. Sterile technique and intraurethral analgesia were used.  Meatus:  Normal size. Normal location. Normal condition.  Urethra:  No strictures.  External Sphincter:  Normal.  Verumontanum:  Normal.  Prostate:  Obstructing. Severe hyperplasia.  Bladder Neck:  Non-obstructing.  Ureteral Orifices:  Normal location. Normal size. Normal shape. Effluxed clear urine.  Bladder:  No trabeculation. No tumors. Normal mucosa. No stones.      The lower urinary tract was carefully examined. The procedure was well-tolerated and without complications. Antibiotic instructions were given. Instructions were given to call the office immediately for bloody urine, difficulty urinating, urinary retention, painful or frequent urination, fever, chills,  nausea, vomiting or other illness. The patient stated that he understood these instructions and would comply with them.   ASSESSMENT:      ICD-10 Details  1 GU:   BPH w/LUTS - N40.1   2   Urinary Retention - R33.8   3   Hydronephrosis - N13.0    PLAN:           Orders Labs BMP  X-Rays: C.T. Hematuria With and Without I.V. Contrast. No Oral Contrast          Schedule Return Visit/Planned Activity: 1 Month             Note: Foley exchange monthly          Document Letter(s):  Created for Patient: Clinical Summary         Notes:    1. BPH with urinary retention: Prostate volume 93 cc by CT. Cystoscopy today with bilateral obstructing prostate. We discussed likely related to BPH. Also discussed possibility of a reflexive bladder. I recommend TURP with concomitant left retrograde pyelogram and left ureteroscopy to evaluate left hydronephrosis as below.. Discussed that if he has a reflexive bladder, he may still be catheter pending however this procedure gives a greater chance of being catheter free. I offered urodynamics however he elects proceed with TURP without obtaining urodynamics prior. He understands risk of retention after the procedure.  Risks and benefits of Transurethral Resection of the Prostate were reviewed in detail including infection, bleeding, blood transfusion, injury to bladder/urethra/surrounding structures, erectile dysfunction, urinary incontinence, bladder neck contracture, persistent obstructive and irritative voiding symptoms, and global anesthesia risks including but not limited to CVA, MI, DVT, PE, pneumonia, and death. He expressed understanding and desire to proceed.   2. Elevated PSA: Will obtain medical records from prior urologist. Do not think that he needs to go undergo TRUS biopsy prior to BPH procedure given that he has had 2 negative prior biopsies. We discussed the risk of uncovering prostate cancer with TURP.   3. Erectile dysfunction: Continue PDE 5  inhibitors as needed. He is not interested in ICI or IPP.   4. Left hydronephrosis: Will further evaluate with CT urogram. Will also plan for a concomitant left retrograde pyelogram and left ureteroscopy.         Next Appointment:      Next Appointment: 01/08/2023 09:30 AM    Appointment Type: Nurse Visit NO Urine    Location: [facility] [phone]    Provider: NVALL NVALL    Reason for Visit: 1 mo foley exchange         Urology Preoperative H&P   Chief Complaint: BPH  History of Present Illness: [name] is a 77 y.o. male with BPH here for TURP. CT Urogram 12/19/2022 with no hydronephrosis. Denies fevers, chills, dysuria. Voiding appropriately.  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 No pertinent family history.  Social History:  reports that he has never smoked. He has never used smokeless tobacco. He reports current alcohol use. He reports that he does not use drugs.  ROS: A complete review of systems was performed.  All systems are negative except for pertinent findings as noted.  Physical Exam:  Vital signs in last 24 hours: Temp:  [97.6 F (36.4 C)] 97.6 F (36.4 C) (06/21 0824) Pulse Rate:  [78] 78 (06/21 0824) Resp:  [18] 18 (06/21 0824) BP: (141)/(77) 141/77 (06/21 0824) SpO2:  [98 %] 98 % (06/21 0824) Weight:  [106.1 kg] 106.1 kg (06/21 0803) Constitutional:  Alert and oriented, No acute distress Cardiovascular: Regular rate and  rhythm Respiratory: Normal respiratory effort, Lungs clear bilaterally GI: Abdomen is soft, nontender, nondistended, no abdominal masses GU: No CVA tenderness Lymphatic: No lymphadenopathy Neurologic: Grossly intact, no focal deficits Psychiatric: Normal mood and affect  Laboratory Data:  Recent Labs    01/10/23 0843  HGB 16.0  HCT 47.0    Recent Labs    01/10/23 0843  NA 139  K 3.5  CL 102  GLUCOSE 109*  BUN 21  CREATININE 1.00     Results for orders placed or performed during the hospital encounter of 01/10/23 (from the past 24 hour(s))  I-STAT, chem 8     Status: Abnormal   Collection Time: 01/10/23  8:43 AM  Result Value  Ref Range   Sodium 139 135 - 145 mmol/L   Potassium 3.5 3.5 - 5.1 mmol/L   Chloride 102 98 - 111 mmol/L   BUN 21 8 - 23 mg/dL   Creatinine, Ser 1.61 0.61 - 1.24 mg/dL   Glucose, Bld 096 (H) 70 - 99 mg/dL   Calcium, Ion 0.45 (L) 1.15 - 1.40 mmol/L   TCO2 26 22 - 32 mmol/L   Hemoglobin 16.0 13.0 - 17.0 g/dL   HCT 40.9 81.1 - 91.4 %   No results found for this or any previous visit (from the past 240 hour(s)).  Renal Function: Recent Labs    01/10/23 0843  CREATININE 1.00   Estimated Creatinine Clearance: 79.1 mL/min (by C-G formula based on SCr of 1 mg/dL).  Radiologic Imaging: No results found.  I independently reviewed the above imaging studies.  Assessment and Plan [name] is a 77 y.o. male with BPH here for TURP.   Risks and benefits of Transurethral Resection of the Prostate were reviewed in detail including infection, bleeding, blood transfusion, injury to bladder/urethra/surrounding structures, erectile dysfunction, urinary incontinence, bladder neck contracture, persistent obstructive and irritative voiding symptoms, and global anesthesia risks including but not limited to CVA, MI, DVT, PE, pneumonia, and death. He expressed understanding and desire to proceed.   Jason R. Gaige Fussner MD 01/10/2023, 8:57 AM  [facility] Pager: [phone]

## 2023-01-10 NOTE — Discharge Instructions (Signed)

## 2023-01-10 NOTE — Anesthesia Preprocedure Evaluation (Addendum)
Anesthesia Evaluation  Patient identified by MRN, date of birth, ID band Patient awake    Reviewed: Allergy & Precautions, NPO status , Patient's Chart, lab work & pertinent test results  History of Anesthesia Complications Negative for: history of anesthetic complications  Airway Mallampati: III  TM Distance: >3 FB Neck ROM: Full    Dental  (+) Dental Advisory Given, Teeth Intact   Pulmonary neg pulmonary ROS   Pulmonary exam normal        Cardiovascular hypertension, Pt. on medications Normal cardiovascular exam     Neuro/Psych negative neurological ROS  negative psych ROS   GI/Hepatic negative GI ROS, Neg liver ROS,,,  Endo/Other  Hypothyroidism    Renal/GU negative Renal ROS     Musculoskeletal  (+) Arthritis ,    Abdominal   Peds  Hematology negative hematology ROS (+)   Anesthesia Other Findings   Reproductive/Obstetrics                             Anesthesia Physical Anesthesia Plan  ASA: 2  Anesthesia Plan: General   Post-op Pain Management: Tylenol PO (pre-op)*   Induction: Intravenous  PONV Risk Score and Plan: 2 and Treatment may vary due to age or medical condition, Ondansetron and Propofol infusion  Airway Management Planned: LMA  Additional Equipment: None  Intra-op Plan:   Post-operative Plan: Extubation in OR  Informed Consent: I have reviewed the patients History and Physical, chart, labs and discussed the procedure including the risks, benefits and alternatives for the proposed anesthesia with the patient or authorized representative who has indicated his/her understanding and acceptance.     Dental advisory given  Plan Discussed with: CRNA and Anesthesiologist  Anesthesia Plan Comments:        Anesthesia Quick Evaluation

## 2023-01-10 NOTE — Anesthesia Postprocedure Evaluation (Signed)
Anesthesia Post Note  Patient: [name]  Procedure(s) Performed: BIPOLAR TRANSURETHRAL RESECTION OF THE PROSTATE (TURP) (Prostate)     Patient location during evaluation: PACU Anesthesia Type: General Level of consciousness: awake and alert Pain management: pain level controlled Vital Signs Assessment: post-procedure vital signs reviewed and stable Respiratory status: spontaneous breathing, nonlabored ventilation and respiratory function stable Cardiovascular status: stable and blood pressure returned to baseline Anesthetic complications: no   No notable events documented.  Last Vitals:  Vitals:   01/10/23 1200 01/10/23 1211  BP: 125/71 131/76  Pulse: 62 68  Resp: 11 14  Temp: (!) 36.2 C (!) 36.2 C  SpO2: 96% 93%    Last Pain:  Vitals:   01/10/23 1145  TempSrc:   PainSc: 0-No pain                 Beryle Lathe

## 2023-01-10 NOTE — OR Nursing (Signed)
16FR foley was removed at 0935H and was discarded. 25 cc of yellow urine noted.

## 2023-01-10 NOTE — Op Note (Signed)
Operative Note  Preoperative diagnosis:  1.  BPH with bladder outlet obstruction  Postoperative diagnosis: 1.  BPH with bladder outlet obstruction  Procedure(s): 1.  Bipolar transurethral resection of prostate  Surgeon: Jettie Pagan, MD  Assistants:  None  Anesthesia:  General  Complications:  None  EBL:  Minimal  Specimens: 1. Prostate chips ID Type Source Tests Collected by Time Destination  1 : prostate chips Tissue PATH Prostate TURP SURGICAL PATHOLOGY Jannifer Hick, MD 01/10/2023 1006    Drains/Catheters: 1.  22Fr 3 way catheter with 30ml water into balloon  Intraoperative findings:   Bilobar obstructing prostate, excellent resection with excellent hemostasis.  Indication:  [name] is a 77 y.o. male with BPH with bladder outlet obstruction presenting for transurethral resection of the prostate. He had a CT volume of 90cc. He was in urinary retention. After thorough discussion including all relevant risk benefits and alternatives, he presents today for a bipolar TURP.  Description of procedure: The indication, alternatives, benefits and risks were discussed with the patient and informed consent was obtained.  Patient was brought to the operating room table, positioned supine, secured with a safety strap.  Pneumatic compression devices were placed on the lower extremities.  After the administration of intravenous antibiotics and general anesthesia, the patient was repositioned into the dorsal lithotomy position.  All pressure points were carefully padded.  A rectal examination was performed confirming a smooth symmetric enlarged gland.  The genitalia were prepped and draped in standard sterile manner.  A timeout was completed, verifying the correct patient, surgical procedure and positioning prior to beginning the procedure.  Isotonic sodium chloride was used for irrigation.  A 26 French continuous-flow resectoscope sheath with the visual obturator and a 30 degree  lens was advanced under direct vision into the bladder.  The anterior urethra appeared normal in its entirety. The prostatic urethra was elongated with bilobar hyperplasia.  On cystoscopic evaluation, his bladder capacity appeared normal, the bladder wall was noted to expand symmetrically in all dimensions.  There were no tumors, stones or foreign bodies present. The bladder was trabeculated with inflamed mucosa consistent with prior foley catheter.  Both ureteral orifices were in their normal anatomic positions with clear urinary reflux noted bilaterally.  The obturator was removed and replaced by the working element with a resection loop.  The location of the ureteral orifices and the prostatic configuration were again confirmed.  Starting at the bladder neck and proceeding distally to the verumontanum a transurethral section of the prostate was performed using bipolar using energy of 4 and 5 for cutting and coagulation, respectively.  The procedure began at the bladder neck at the 5 o'clock and 7 o'clock positions and carefully carried distally to the verumontanum, resecting the intervening prostatic adenoma.  Next the left lateral lobe was resected to the level of the transverse capsular fibers.  The identical procedure was performed on the right lobe.  Attention was then directed anteriorly and the resection was completed from the 10 o'clock to 2 o'clock positions.  All bleeding vessels were fulgurated achieving meticulous hemostasis.  The bladder was irrigated with a Toomey syringe, ensuring removal of all prostate chips which were sent to pathology for evaluation.  Having completed the resection and the chips removed, we again confirmed hemostasis with the loop with coagulating current.  Upon completion of the entire procedure, the bladder and posterior urethra were reexamined, confirming open prostatic urethra and bladder neck without evidence of bleeding or perforation.  Both ureteral  orifices and the  external sphincter were noted to be intact.  The resectoscope was withdrawn under direct vision and a 22 French three-way Foley catheter with a 30 cc balloon was inserted into the bladder.  The balloon was inflated with 30 cc of sterile water and the catheter was placed on gentle traction.  After multiple manual irrigations ensuring clear return of the irrigant, the procedure was terminated.  The catheter was attached to a drainage bag and continuous bladder irrigation was started with normal saline.  The patient was positioned supine.  At the end of the procedure, all counts were correct.  Patient tolerated the procedure well and was taken to the recovery room satisfactory condition.  Plan: Continuous bladder irrigation overnight with gentle Foley traction.  Plan to discharge home tomorrow with Foley catheter in place and void trial in the office in 3 days.  Matt R. Gunther Zawadzki MD [facility]  Pager: [phone]

## 2023-01-10 NOTE — Transfer of Care (Signed)
Immediate Anesthesia Transfer of Care Note  Patient: [name]  Procedure(s) Performed: Procedure(s) (LRB): BIPOLAR TRANSURETHRAL RESECTION OF THE PROSTATE (TURP) (N/A)  Patient Location: PACU  Anesthesia Type: General  Level of Consciousness: awake, sedated, patient cooperative and responds to stimulation  Airway & Oxygen Therapy: Patient Spontanous Breathing and Patient connected to [place] oxygen  Post-op Assessment: Report given to PACU RN, Post -op Vital signs reviewed and stable and Patient moving all extremities  Post vital signs: Reviewed and stable  Complications: No apparent anesthesia complications

## 2023-01-11 DIAGNOSIS — N401 Enlarged prostate with lower urinary tract symptoms: Secondary | ICD-10-CM | POA: Diagnosis not present

## 2023-01-11 LAB — BASIC METABOLIC PANEL
Anion gap: 11 (ref 5–15)
BUN: 16 mg/dL (ref 8–23)
CO2: 20 mmol/L — ABNORMAL LOW (ref 22–32)
Calcium: 8.4 mg/dL — ABNORMAL LOW (ref 8.9–10.3)
Chloride: 102 mmol/L (ref 98–111)
Creatinine, Ser: 0.86 mg/dL (ref 0.61–1.24)
GFR, Estimated: >60 mL/min (ref >60)
Glucose, Bld: 191 mg/dL — ABNORMAL HIGH (ref 70–99)
Potassium: 4 mmol/L (ref 3.5–5.1)
Sodium: 133 mmol/L — ABNORMAL LOW (ref 135–145)

## 2023-01-11 LAB — CBC
HCT: 42 % (ref 39.0–52.0)
Hemoglobin: 13.7 g/dL (ref 13.0–17.0)
MCH: 27.8 pg (ref 26.0–34.0)
MCHC: 32.6 g/dL (ref 30.0–36.0)
MCV: 85.2 fL (ref 80.0–100.0)
Platelets: 209 10*3/uL (ref 150–400)
RBC: 4.93 MIL/uL (ref 4.22–5.81)
RDW: 13.6 % (ref 11.5–15.5)
WBC: 13.8 10*3/uL — ABNORMAL HIGH (ref 4.0–10.5)
nRBC: 0 % (ref 0.0–0.2)

## 2023-01-11 IMAGING — RF DG HIP (WITH PELVIS) OPERATIVE*L*
1 series · 3 of 3 positions shown · non-contrast
Comparison: Radiograph 12/26/2020

CLINICAL DATA: Total left hip replacement

EXAM:
OPERATIVE left HIP (WITH PELVIS IF PERFORMED) 3 VIEWS
TECHNIQUE: Fluoroscopic spot image(s) were submitted for interpretation
post-operatively.

[Series 1: unknown protocol · 0.20mm/px · 3 of 3 slices shown]
[im 1/3]
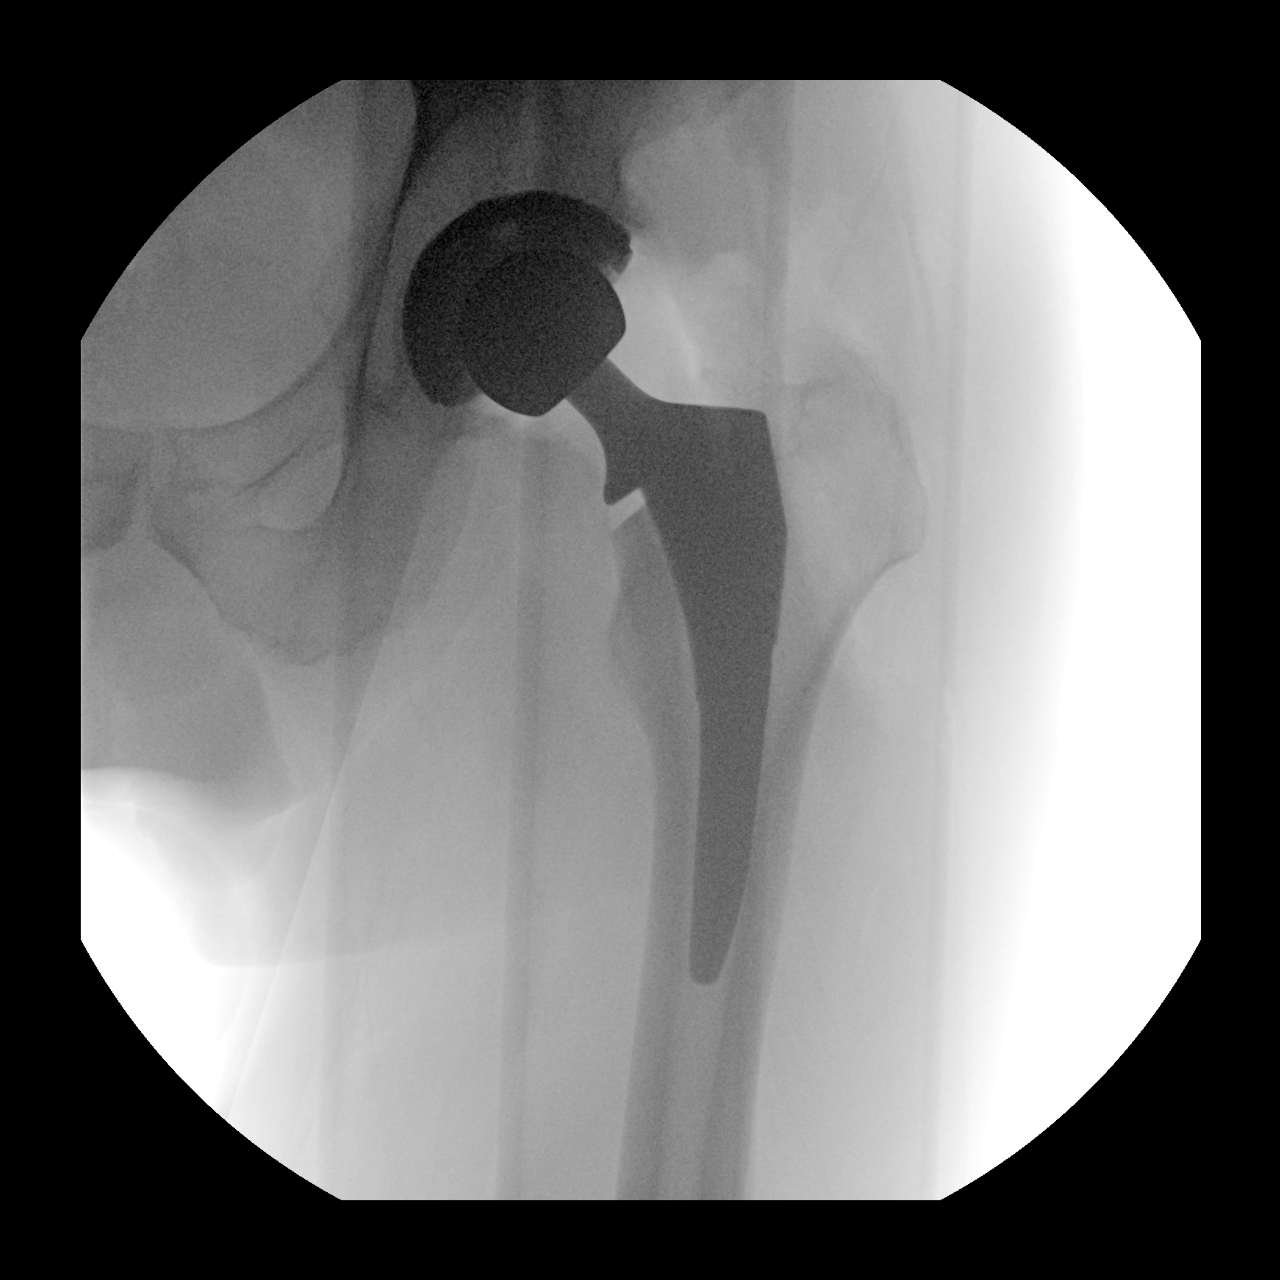
[im 2/3]
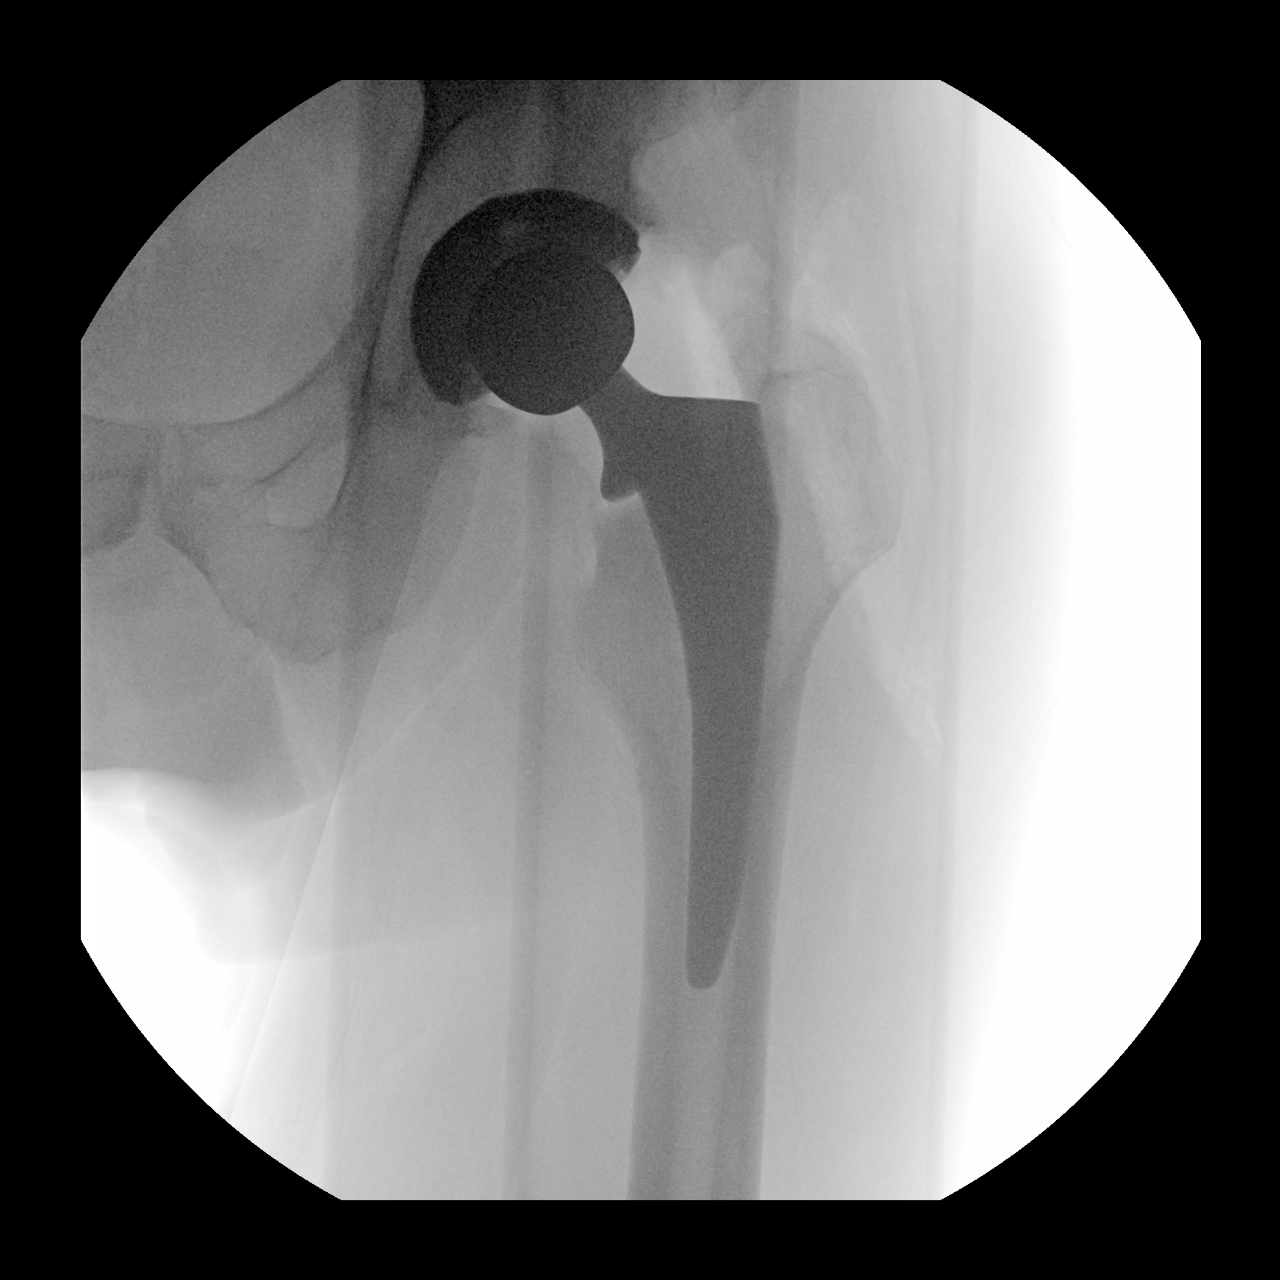
[im 3/3]
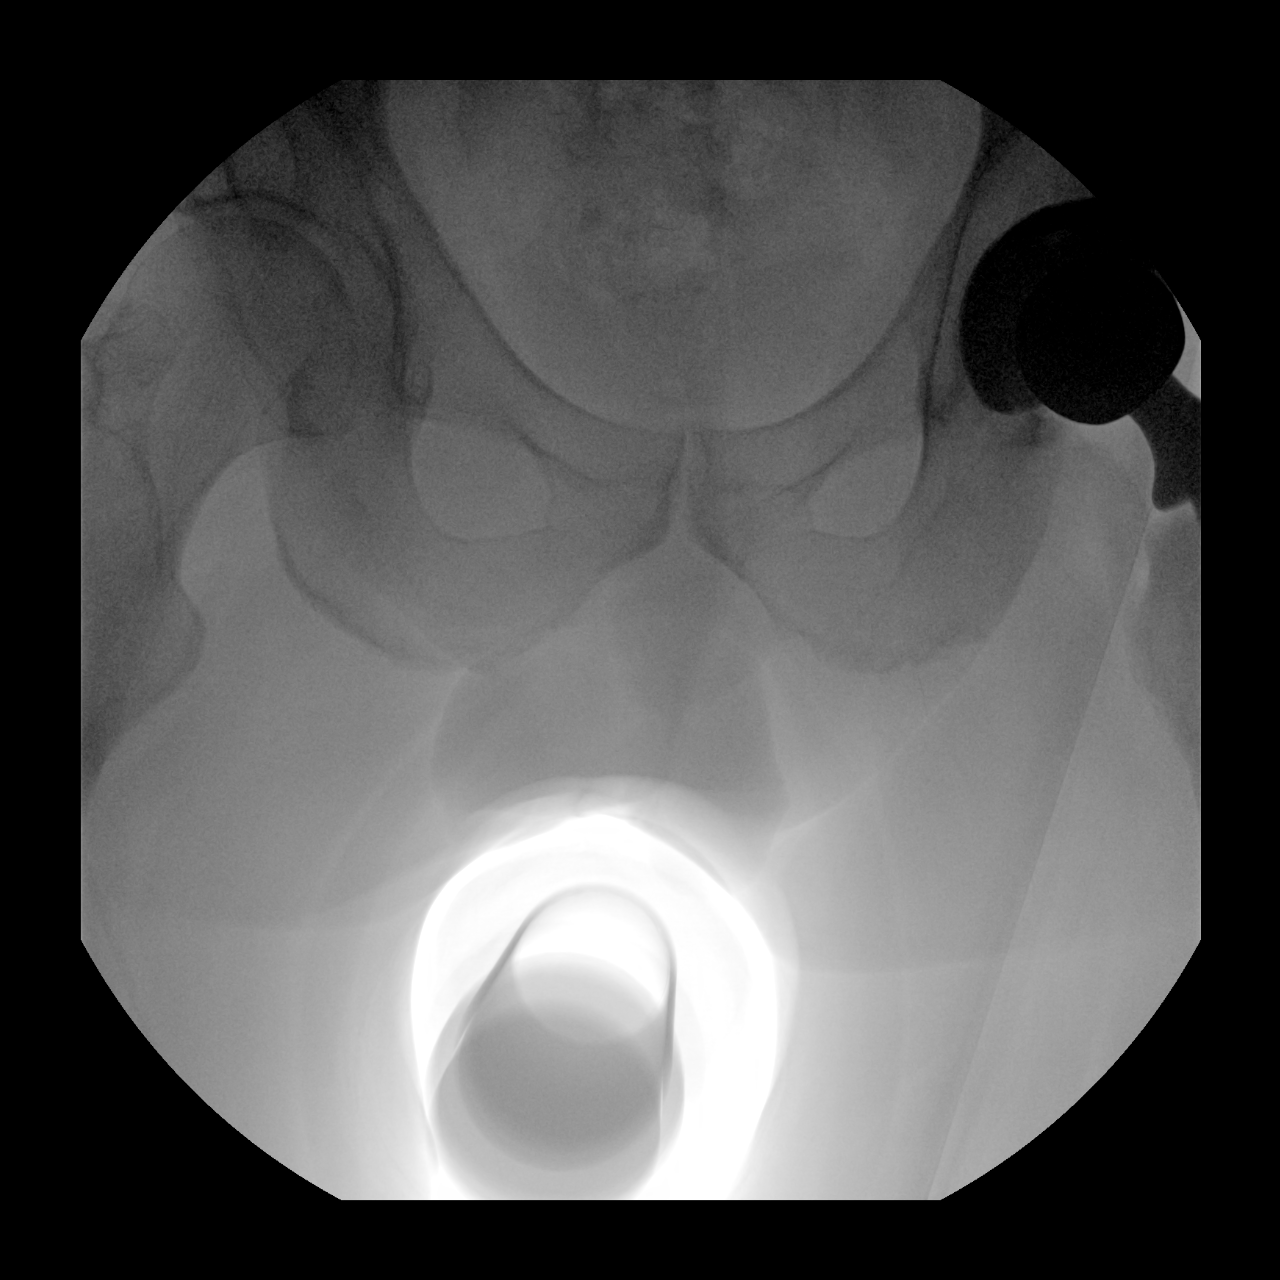

[3 of 3 positions shown; findings below may reference images not displayed]

FINDINGS: Intraoperative fluoroscopic images during left total hip
arthroplasty. Hardware is in normal alignment without evidence of
loosening or fracture.
IMPRESSION: Intraoperative images during left hip arthroplasty. No evidence of
immediate hardware complication.

## 2023-01-11 IMAGING — DX DG PORTABLE PELVIS
1 series · 1 of 1 positions shown · non-contrast
Comparison: None.

CLINICAL DATA: Status post hip replacement

EXAM:
PORTABLE PELVIS 1-2 VIEWS

[pelvis ap]
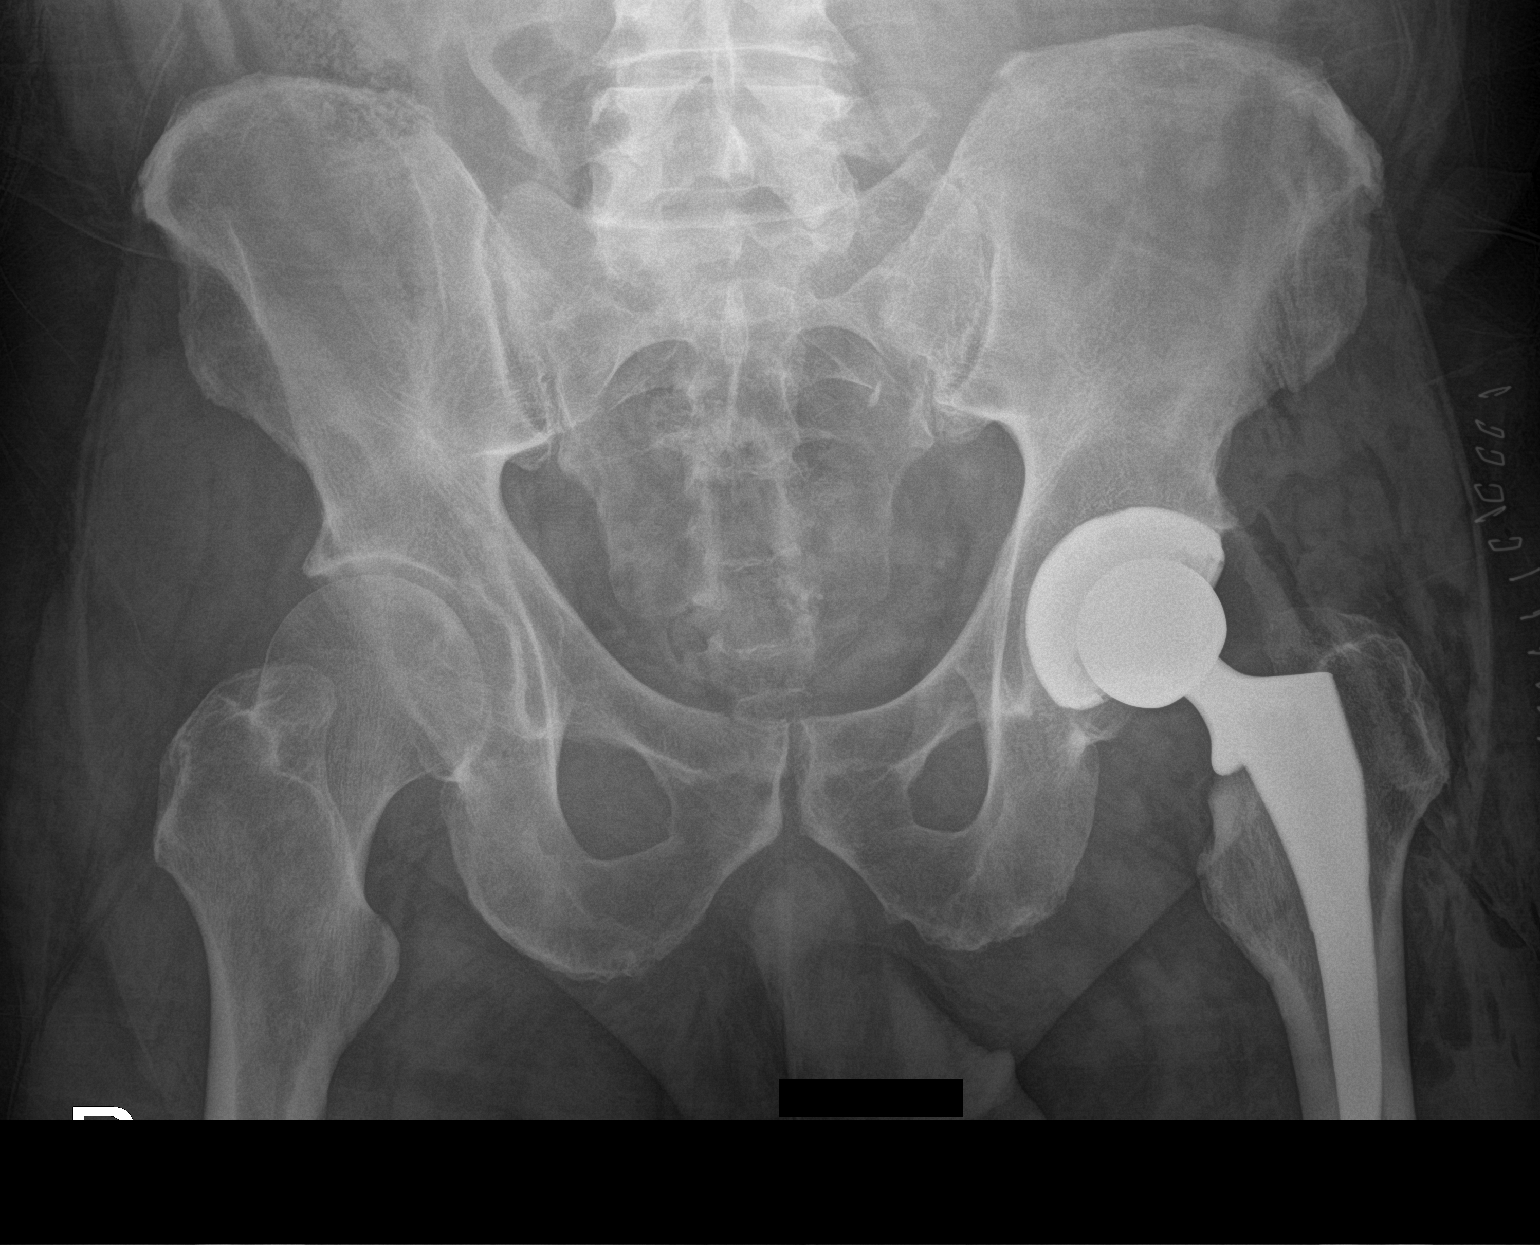

[1 of 1 positions shown; findings below may reference images not displayed]

FINDINGS: There is a left hip arthroplasty normal alignment without evidence
of loosening or periprosthetic fracture. Expected soft tissue
changes.
IMPRESSION: Left hip arthroplasty without evidence of immediate hardware
complication.

## 2023-01-11 NOTE — Progress Notes (Signed)
1 Day Post-Op Subjective: Patient feeling well status post TURP.  Urine remains clear off CBI this morning  Objective: Vital signs in last 24 hours: Temp:  [97.1 F (36.2 C)-98.2 F (36.8 C)] 98.1 F (36.7 C) (06/22 0624) Pulse Rate:  [58-78] 58 (06/22 0624) Resp:  [9-18] 18 (06/22 0624) BP: (115-141)/(68-83) 135/83 (06/22 0624) SpO2:  [91 %-99 %] 94 % (06/22 0624) Weight:  [106.1 kg] 106.1 kg (06/21 0803)  Intake/Output from previous day: 06/21 0701 - 06/22 0700 In: 5435 [P.O.:120; I.V.:2615; IV Piggyback:100] Out: 5425 [Urine:5400; Blood:25] Intake/Output this shift: No intake/output data recorded.  Physical Exam:  General: Alert and oriented  Lab Results: Recent Labs    01/10/23 0843 01/10/23 1141  HGB 16.0 14.2  HCT 47.0 43.0   BMET Recent Labs    01/10/23 0843 01/10/23 1141  NA 139 134*  K 3.5 3.6  CL 102 103  CO2  --  25  GLUCOSE 109* 129*  BUN 21 20  CREATININE 1.00 1.13  CALCIUM  --  8.2*     Studies/Results: No results found.  Assessment/Plan: 1.  Status post TURP, doing well Plan/recommendation.  Plan for catheter plug in the irrigation port today disc charge home with Foley catheter scheduled for follow-up on 01/13/2023 for Foley removal and voiding trial.    LOS: 0 days   Belva Agee 01/11/2023, 7:09 AM

## 2023-01-11 NOTE — Discharge Summary (Signed)
Date of admission: 01/10/2023  Date of discharge: 01/11/2023  Admission diagnosis: BPH with urinary retention  Discharge diagnosis: BPH with urinary retention  Secondary diagnoses:   History and Physical: For full details, please see admission history and physical. Briefly, [name] is a 77 y.o. year old patient with white male with BPH and urinary retention.  Admitted at this time undergo cystoscopy and TURP.   Hospital Course: Patient was admitted on 01/10/2023 and underwent cystoscopy and TURP.  For details procedure please see the typed operative note.  Patient's postoperative course unremarkable.  CBI was weaned on the first postoperative night urine was clear on the first postoperative morning.  CBI was discontinued and catheter plug placed in the irrigation port of the Foley.  Patient was felt ready for discharge home.  He will be discharged home with Foley catheter with plans to return on 01/13/2023 for Foley removal and voiding trial.  Patient has our telephone number and knows to call should problems arise relative to his management in the interim.  Laboratory values:  Recent Labs    01/10/23 0843 01/10/23 1141  HGB 16.0 14.2  HCT 47.0 43.0   Recent Labs    01/10/23 0843 01/10/23 1141  CREATININE 1.00 1.13    Disposition: Home  Discharge instruction: The patient was instructed to be ambulatory but told to refrain from heavy lifting, strenuous activity, or driving.   Discharge medications:  Allergies as of 01/11/2023   No Known Allergies      Medication List     TAKE these medications    amLODipine 5 MG tablet Commonly known as: NORVASC Take 5 mg by mouth daily. PT TAKES IN THE PM   atorvastatin 20 MG tablet Commonly known as: LIPITOR Take 20 mg by mouth every evening.   docusate sodium 100 MG capsule Commonly known as: Colace Take 1 capsule (100 mg total) by mouth daily as needed for up to 30 doses.   Fish Oil Triple Strength 1400 MG Caps Take  1,400 mg by mouth daily.   levothyroxine 75 MCG tablet Commonly known as: SYNTHROID Take 75 mcg by mouth daily before breakfast. PT TAKES IN THE PM   multivitamin with minerals Tabs tablet Take 1 tablet by mouth daily.   multivitamin-lutein Caps capsule Take 1 capsule by mouth daily.   OVER THE COUNTER MEDICATION Take 1 tablet by mouth daily as needed (over the counter allergy med).   oxyCODONE-acetaminophen 5-325 MG tablet Commonly known as: Percocet Take 1 tablet by mouth every 4 (four) hours as needed for up to 18 doses for severe pain.   tamsulosin 0.4 MG Caps capsule Commonly known as: FLOMAX Take 0.8 capsules by mouth daily.   VITAMIN D3 PO Take 1 tablet by mouth daily.        Followup:  01/13/2023 as scheduled for voiding trial

## 2023-01-13 ENCOUNTER — Encounter (HOSPITAL_BASED_OUTPATIENT_CLINIC_OR_DEPARTMENT_OTHER): Payer: Self-pay | Admitting: Urology

## 2023-01-13 LAB — SURGICAL PATHOLOGY
